# Patient Record
Sex: Female | Born: 1935
Health system: Southern US, Community
[De-identification: ages and names within clinical notes are randomized; demographics above are authoritative.]

## PROBLEM LIST (undated history)

## (undated) DIAGNOSIS — I236 Thrombosis of atrium, auricular appendage, and ventricle as current complications following acute myocardial infarction: Secondary | ICD-10-CM

## (undated) DIAGNOSIS — F0390 Unspecified dementia without behavioral disturbance: Secondary | ICD-10-CM

## (undated) DIAGNOSIS — I255 Ischemic cardiomyopathy: Secondary | ICD-10-CM

## (undated) DIAGNOSIS — I2510 Atherosclerotic heart disease of native coronary artery without angina pectoris: Secondary | ICD-10-CM

## (undated) DIAGNOSIS — I213 ST elevation (STEMI) myocardial infarction of unspecified site: Secondary | ICD-10-CM

## (undated) DIAGNOSIS — E785 Hyperlipidemia, unspecified: Secondary | ICD-10-CM

## (undated) DIAGNOSIS — I10 Essential (primary) hypertension: Secondary | ICD-10-CM

## (undated) DIAGNOSIS — R42 Dizziness and giddiness: Secondary | ICD-10-CM

## (undated) HISTORY — PX: CHOLECYSTECTOMY: SHX55

## (undated) HISTORY — DX: Ischemic cardiomyopathy: I25.5

## (undated) HISTORY — PX: RENAL BIOPSY: SHX156

## (undated) HISTORY — PX: APPENDECTOMY: SHX54

## (undated) HISTORY — DX: ST elevation (STEMI) myocardial infarction of unspecified site: I21.3

## (undated) HISTORY — DX: Dizziness and giddiness: R42

## (undated) HISTORY — PX: HIP SURGERY: SHX245

## (undated) HISTORY — DX: Hyperlipidemia, unspecified: E78.5

## (undated) HISTORY — DX: Thrombosis of atrium, auricular appendage, and ventricle as current complications following acute myocardial infarction: I23.6

## (undated) HISTORY — DX: Essential (primary) hypertension: I10

## (undated) HISTORY — DX: Unspecified dementia, unspecified severity, without behavioral disturbance, psychotic disturbance, mood disturbance, and anxiety: F03.90

## (undated) HISTORY — DX: Atherosclerotic heart disease of native coronary artery without angina pectoris: I25.10

---

## 2010-01-17 ENCOUNTER — Inpatient Hospital Stay (HOSPITAL_COMMUNITY): Admission: RE | Admit: 2010-01-17 | Discharge: 2010-01-24 | Payer: Self-pay | Admitting: Cardiology

## 2010-01-17 ENCOUNTER — Ambulatory Visit: Payer: Self-pay | Admitting: Cardiology

## 2010-01-17 ENCOUNTER — Encounter: Payer: Self-pay | Admitting: Cardiology

## 2010-01-18 ENCOUNTER — Encounter: Payer: Self-pay | Admitting: Cardiovascular Disease

## 2010-01-18 ENCOUNTER — Encounter: Payer: Self-pay | Admitting: Cardiology

## 2010-01-18 ENCOUNTER — Ambulatory Visit: Payer: Self-pay | Admitting: Vascular Surgery

## 2010-01-23 ENCOUNTER — Ambulatory Visit: Payer: Self-pay | Admitting: Physical Medicine & Rehabilitation

## 2010-01-24 ENCOUNTER — Ambulatory Visit: Payer: Self-pay | Admitting: Internal Medicine

## 2010-01-24 ENCOUNTER — Inpatient Hospital Stay (HOSPITAL_COMMUNITY)
Admission: RE | Admit: 2010-01-24 | Discharge: 2010-02-07 | Payer: Self-pay | Admitting: Physical Medicine & Rehabilitation

## 2010-01-24 ENCOUNTER — Encounter: Payer: Self-pay | Admitting: Cardiology

## 2010-01-24 ENCOUNTER — Ambulatory Visit: Payer: Self-pay | Admitting: Physical Medicine & Rehabilitation

## 2010-01-26 ENCOUNTER — Encounter: Payer: Self-pay | Admitting: Internal Medicine

## 2010-01-26 ENCOUNTER — Encounter: Payer: Self-pay | Admitting: Cardiology

## 2010-02-02 ENCOUNTER — Encounter: Payer: Self-pay | Admitting: Cardiovascular Disease

## 2010-02-06 ENCOUNTER — Encounter: Payer: Self-pay | Admitting: Cardiology

## 2010-02-07 ENCOUNTER — Encounter: Payer: Self-pay | Admitting: Cardiology

## 2010-02-10 ENCOUNTER — Encounter: Payer: Self-pay | Admitting: Cardiology

## 2010-02-10 ENCOUNTER — Telehealth: Payer: Self-pay | Admitting: Cardiology

## 2010-02-10 LAB — CONVERTED CEMR LAB: Prothrombin Time: 33.5 s

## 2010-02-14 ENCOUNTER — Encounter: Payer: Self-pay | Admitting: Cardiovascular Disease

## 2010-02-15 ENCOUNTER — Telehealth: Payer: Self-pay | Admitting: Cardiology

## 2010-02-15 ENCOUNTER — Telehealth (INDEPENDENT_AMBULATORY_CARE_PROVIDER_SITE_OTHER): Payer: Self-pay | Admitting: *Deleted

## 2010-02-15 ENCOUNTER — Encounter: Payer: Self-pay | Admitting: Cardiology

## 2010-02-15 LAB — CONVERTED CEMR LAB: INR: 1.71

## 2010-02-21 ENCOUNTER — Ambulatory Visit: Payer: Self-pay | Admitting: Cardiology

## 2010-02-21 ENCOUNTER — Encounter: Payer: Self-pay | Admitting: Cardiology

## 2010-02-21 DIAGNOSIS — I2129 ST elevation (STEMI) myocardial infarction involving other sites: Secondary | ICD-10-CM

## 2010-02-21 DIAGNOSIS — Z8673 Personal history of transient ischemic attack (TIA), and cerebral infarction without residual deficits: Secondary | ICD-10-CM

## 2010-02-21 DIAGNOSIS — I10 Essential (primary) hypertension: Secondary | ICD-10-CM

## 2010-02-21 DIAGNOSIS — S72009A Fracture of unspecified part of neck of unspecified femur, initial encounter for closed fracture: Secondary | ICD-10-CM | POA: Insufficient documentation

## 2010-02-21 DIAGNOSIS — I238 Other current complications following acute myocardial infarction: Secondary | ICD-10-CM

## 2010-02-21 DIAGNOSIS — I2109 ST elevation (STEMI) myocardial infarction involving other coronary artery of anterior wall: Secondary | ICD-10-CM

## 2010-03-02 ENCOUNTER — Encounter: Payer: Self-pay | Admitting: Cardiology

## 2010-03-03 ENCOUNTER — Encounter: Payer: Self-pay | Admitting: Cardiology

## 2010-03-03 ENCOUNTER — Telehealth: Payer: Self-pay | Admitting: Cardiology

## 2010-03-03 LAB — CONVERTED CEMR LAB: Prothrombin Time: 39.3 s

## 2010-03-09 ENCOUNTER — Encounter: Payer: Self-pay | Admitting: Cardiology

## 2010-03-09 ENCOUNTER — Telehealth: Payer: Self-pay | Admitting: Cardiology

## 2010-03-09 LAB — CONVERTED CEMR LAB
INR: 2.83
Prothrombin Time: 28.6 s

## 2010-03-17 ENCOUNTER — Telehealth: Payer: Self-pay | Admitting: Cardiology

## 2010-03-17 ENCOUNTER — Encounter: Payer: Self-pay | Admitting: Cardiology

## 2010-03-28 ENCOUNTER — Encounter: Payer: Self-pay | Admitting: Cardiology

## 2010-03-29 ENCOUNTER — Encounter: Payer: Self-pay | Admitting: Cardiology

## 2010-03-29 ENCOUNTER — Telehealth: Payer: Self-pay | Admitting: Cardiology

## 2010-03-29 LAB — CONVERTED CEMR LAB: Prothrombin Time: 32.1 s

## 2010-04-10 ENCOUNTER — Telehealth (INDEPENDENT_AMBULATORY_CARE_PROVIDER_SITE_OTHER): Payer: Self-pay | Admitting: *Deleted

## 2010-04-11 ENCOUNTER — Ambulatory Visit: Payer: Self-pay | Admitting: Cardiology

## 2010-04-12 ENCOUNTER — Ambulatory Visit: Payer: Self-pay | Admitting: Cardiology

## 2010-04-19 ENCOUNTER — Ambulatory Visit: Payer: Self-pay | Admitting: Cardiology

## 2010-04-19 ENCOUNTER — Encounter: Payer: Self-pay | Admitting: Physician Assistant

## 2010-04-19 DIAGNOSIS — I2510 Atherosclerotic heart disease of native coronary artery without angina pectoris: Secondary | ICD-10-CM | POA: Insufficient documentation

## 2010-04-19 DIAGNOSIS — I2589 Other forms of chronic ischemic heart disease: Secondary | ICD-10-CM | POA: Insufficient documentation

## 2010-04-19 DIAGNOSIS — E782 Mixed hyperlipidemia: Secondary | ICD-10-CM

## 2010-04-21 ENCOUNTER — Encounter (INDEPENDENT_AMBULATORY_CARE_PROVIDER_SITE_OTHER): Payer: Self-pay | Admitting: *Deleted

## 2010-05-17 ENCOUNTER — Encounter: Payer: Self-pay | Admitting: Cardiology

## 2010-06-08 ENCOUNTER — Encounter: Payer: Self-pay | Admitting: Cardiology

## 2010-07-21 ENCOUNTER — Encounter: Payer: Self-pay | Admitting: Cardiology

## 2010-07-25 ENCOUNTER — Encounter (INDEPENDENT_AMBULATORY_CARE_PROVIDER_SITE_OTHER): Payer: Self-pay | Admitting: *Deleted

## 2010-08-03 ENCOUNTER — Ambulatory Visit: Payer: Self-pay | Admitting: Cardiology

## 2010-08-15 ENCOUNTER — Telehealth (INDEPENDENT_AMBULATORY_CARE_PROVIDER_SITE_OTHER): Payer: Self-pay | Admitting: *Deleted

## 2010-08-29 ENCOUNTER — Encounter: Payer: Self-pay | Admitting: Cardiology

## 2010-10-31 NOTE — Medication Information (Signed)
Summary: ccr-lr  Anticoagulant Therapy  Managed by: Vashti Hey RN PCP: Roswell Miners MD: Andee Lineman MD, Michelle Piper Indication 1: apical thrombus 453.8 Indication 2: DVT Lab Used: [facility] [place] Site: [place] INR POC 3.0  Dietary changes: no    Health status changes: no    Bleeding/hemorrhagic complications: no    Recent/future hospitalizations: no    Any changes in medication regimen? no    Recent/future dental: no  Any missed doses?: no       Is patient compliant with meds? yes       Allergies: 1)  ! Penicillin  Anticoagulation Management History:      The patient is taking warfarin and comes in today for a routine follow up visit.  Positive risk factors for bleeding include an age of 61 years or older.  The bleeding index is 'intermediate risk'.  Positive CHADS2 values include History of HTN and Age > 71 years old.  Her last INR was 3.19.  Anticoagulation responsible provider: Andee Lineman MD, Michelle Piper.  INR POC: 3.0.    Anticoagulation Management Assessment/Plan:      The patient's current anticoagulation dose is Warfarin sodium 2 mg tabs: use as directed.  The target INR is 2.0-3.0.  The next INR is due 05/02/2010.  Anticoagulation instructions were given to patient.  Results were reviewed/authorized by Vashti Hey RN.  She was notified by Vashti Hey RN.         Prior Anticoagulation Instructions: Received fax from [facility] with results of PT/INR obtained on pt today.  PT 32.1  INR 3.19  Spoke with Christoper Allegra RN [facility] who states she discharged pt today.  Called pt's son, Clide Cliff, who manages pt's meds.  Told him to have pt. take coumadin 1mg  tonight then resume 2mg  once daily except 1mg  on Mondays and Fridays.  Next INR check will be in the [place] office on 04/11/10.  Ricky verbalized understanding.  Current Anticoagulation Instructions: INR 3.0 Decrease coumadin to 2mg  once daily except 1mg  on M,W,F

## 2010-10-31 NOTE — Miscellaneous (Signed)
Summary: Home Care Report/ [facility] CARE  Home Care Report/ [facility] CARE   Imported By: Dorise Hiss 06/13/2010 14:06:02  _____________________________________________________________________  External Attachment:    Type:   Image     Comment:   External Document

## 2010-10-31 NOTE — Progress Notes (Signed)
Summary: coumadin management  Phone Note Other Incoming   Caller: Christoper Allegra RN [facility] [phone] Reason for Call: Discuss lab or test results Summary of Call: Called re. PT/INR obtained on pt yesterday at [facility].  PT 33.5  INR 3.32  Pt was in hospital at [facility] from 01/24/10 - 02/07/10 for anterior MI.  She has been on coumadin for a DVT in the past and a new atrial thrombus.  Order given for pt to hold coumadin tonight then decrease dose to 2mg  once daily except 1mg  on Mondays.  Home Health to recheck INR on 02/15/10. Initial call taken by: Vashti Hey RN,  Feb 10, 2010 2:39 PM     Anticoagulant Therapy  Managed by: Vashti Hey RN Supervising MD: Myrtis Ser MD, Tinnie Gens Indication 1: apical thrombus 453.8 Indication 2: DVT Lab Used: [facility] Site: [place] PT 33.5  Dietary changes: no    Health status changes: no    Bleeding/hemorrhagic complications: no    Recent/future hospitalizations: yes       Details: [facility] 01/24/10 - 02/07/10 for anterior MI  Any changes in medication regimen? yes       Details: On coumadin for old DVT now new apical thrombus.  Has 2mg  tablet  Has been on 2mg  qd   Recent/future dental: no  Any missed doses?: no       Is patient compliant with meds? yes      Comments: pt being seen by [facility].  Recent hip replacement on 01/02/10     Anticoagulation Management History:      Her anticoagulation is being managed by telephone today.  Positive risk factors for bleeding include an age of 22 years or older.  The bleeding index is 'intermediate risk'.  Positive CHADS2 values include Age > 55 years old.  Today's INR is 3.32.  Prothrombin time is 33.5.  Anticoagulation responsible provider: Myrtis Ser MD, Tinnie Gens.    Anticoagulation Management Assessment/Plan:      The next INR is due 02/15/2010.  Anticoagulation instructions were given to Christoper Allegra RN .  Results were  reviewed/authorized by Vashti Hey RN.  She was notified by Vashti Hey RN.         Current Anticoagulation Instructions: Called re. PT/INR obtained on pt yesterday at [facility].  PT 33.5  INR 3.32  Pt was in hospital at [facility] from 01/24/10 - 02/07/10 for anterior MI.  She has been on coumadin for a DVT in the past and a new atrial thrombus.  Order given for pt to hold coumadin tonight then decrease dose to 2mg  once daily except 1mg  on Mondays.  Home Health to recheck INR on 02/15/10.

## 2010-10-31 NOTE — Progress Notes (Signed)
Summary: elevated bp   Phone Note Other Incoming   Caller: son - Clide Cliff  Summary of Call: Voice message left regarding elevated blood pressure in the afternoons.  153/92, 149/89, 150/86.  Has OV with urology today at 12:45.  Did not leave a return phone number on his message.  Tried to return call to number listed in mothers chart.  First visit with GD is 5/24.     No answer.  Hoover Brunette, LPN  Feb 15, 2010 4:47 PM

## 2010-10-31 NOTE — Progress Notes (Signed)
Summary: coumadin management  Phone Note Outgoing Call   Call placed by: Vashti Hey RN Call placed to: [facility] RN [facility] Reason for Call: Discuss lab or test results Summary of Call: Received fax from [facility] in [place] with results of PT/INR ontained on pt today.  PT 28.6  INR 2.83  Order given for pt to continue coumadin 2mg  once daily except 1mg  on Mondays and Fridays and INR to be rechecked by Home Health on 03/16/10. Initial call taken by: Vashti Hey RN,  March 09, 2010 4:24 PM     Anticoagulant Therapy  Managed by: Vashti Hey RN PCP: Roswell Miners MD: Andee Lineman MD, Michelle Piper Indication 1: apical thrombus 453.8 Indication 2: DVT Lab Used: [facility] Lab  Site: [place] PT 28.6  Dietary changes: no    Health status changes: no    Bleeding/hemorrhagic complications: no    Recent/future hospitalizations: no    Any changes in medication regimen? no    Recent/future dental: no  Any missed doses?: no       Is patient compliant with meds? yes         Anticoagulation Management History:      Her anticoagulation is being managed by telephone today.  Positive risk factors for bleeding include an age of 21 years or older.  The bleeding index is 'intermediate risk'.  Positive CHADS2 values include History of HTN and Age > 68 years old.  Her last INR was 3.90 and today's INR is 2.83.  Prothrombin time is 28.6.  Anticoagulation responsible provider: Andee Lineman MD, Michelle Piper.    Anticoagulation Management Assessment/Plan:      The patient's current anticoagulation dose is Warfarin sodium 2 mg tabs: use as directed.  The target INR is 2.0-3.0.  The next INR is due 03/16/2010.  Anticoagulation instructions were given to [facility].  Results were reviewed/authorized by Vashti Hey RN.  She was notified by [facility].         Prior Anticoagulation Instructions: [facility]ce[facility] fax with results of PT/INR obtained on pt 03/02/10.  PT 39.3   INR 3.90   Order given for pt to hold coumadin tonight then decrease dose to 2mg  once daily except 1mg  on Mondays and Fridays.  Home Health to recheck INR on 03/09/10.  Current Anticoagulation Instructions: Received fax from [facility]. Hospital in [place] with results of PT/INR ontained on pt today.  PT 28.6  INR 2.83  Order given for pt to continue coumadin 2mg  once daily except 1mg  on Mondays and Fridays and INR to be rechecked by Home Health on 03/16/10.

## 2010-10-31 NOTE — Miscellaneous (Signed)
Summary: Orders Update  Clinical Lists Changes  Orders: Added new Referral order of 2-D Echocardiogram (2D Echo) - Signed 

## 2010-10-31 NOTE — Medication Information (Signed)
Summary: ccr-lr  Anticoagulant Therapy  Managed by: Vashti Hey RN Supervising MD: Andee Lineman MD, Michelle Piper Indication 1: apical thrombus 453.8 Indication 2: DVT Lab Used: [facility] [place] Site: [place] INR POC 2.9  Dietary changes: no    Health status changes: no    Bleeding/hemorrhagic complications: no    Recent/future hospitalizations: no    Any changes in medication regimen? no    Recent/future dental: no  Any missed doses?: no       Is patient compliant with meds? yes       Allergies: 1)  ! Penicillin  Anticoagulation Management History:      Positive risk factors for bleeding include an age of 75 years or older.  The bleeding index is 'intermediate risk'.  Positive CHADS2 values include History of HTN and Age > 19 years old.  Her last INR was 1.71.  Anticoagulation responsible provider: Andee Lineman MD, Michelle Piper.  INR POC: 2.9.    Anticoagulation Management Assessment/Plan:      The target INR is 2.0-3.0.  The next INR is due 03/02/2010.  Anticoagulation instructions were given to Family/Sara Swaziland RN .  Results were reviewed/authorized by Vashti Hey RN.  She was notified by Vashti Hey RN.         Prior Anticoagulation Instructions: Received call from [facility] with results of PT/INR obtained on pt yesterday.  PT 17.3  INR 1.71  Order given for pt to take 2mg  once daily except 3mg  on Wednesdays starting today.  INR to be rechecked by [facility] on 02/22/10.  Current Anticoagulation Instructions: INR 2.9 Decrease coumadin to 2mg  once daily  Order given to Sara Swaziland RN [facility] to recheck INR 03/02/10.

## 2010-10-31 NOTE — Cardiovascular Report (Signed)
Summary: Cardiac Catheterization  Cardiac Catheterization   Imported By: Dorise Hiss 02/21/2010 08:28:49  _____________________________________________________________________  External Attachment:    Type:   Image     Comment:   External Document

## 2010-10-31 NOTE — Letter (Signed)
Summary: Engineer, materials at [facility]  [address]   [place], [place] [address]   Phone: [phone]  Fax: [phone]        July 25, 2010 MRN: [id number]   [facility] [address] [place], [place]  [address]   Dear Ms. Rounds,  Your test ordered by Selena Batten has been reviewed by your physician (or physician assistant) and was found to be normal or stable. Your physician (or physician assistant) felt no changes were needed at this time.  ____ Echocardiogram  ____ Cardiac Stress Test  __X__ Lab Work - lipid panel at goal  ____ Peripheral vascular study of arms, legs or neck  ____ CT scan or X-ray  ____ Lung or Breathing test  ____ Other:   Thank you.   Hoover Brunette, LPN    Duane Boston, M.D., F.A.C.C. Thressa Sheller, M.D., F.A.C.C. Oneal Grout, M.D., F.A.C.C. Cheree Ditto, M.D., F.A.C.C. Daiva Nakayama, M.D., F.A.C.C. Kenney Houseman, M.D., F.A.C.C. Jeanne Ivan, PA-C

## 2010-10-31 NOTE — Letter (Signed)
Summary: Engineer, materials at [facility]  [address]   [place], [place] [address]   Phone: [phone]  Fax: [phone]        April 21, 2010 MRN: [id number]   [facility] [address] [place], [place]  [address]   Dear Ms. Madruga,  Your test ordered by Selena Batten has been reviewed by your physician (or physician assistant) and was found to be normal or stable. Your physician (or physician assistant) felt no changes were needed at this time.  ____ Echocardiogram  ____ Cardiac Stress Test  __X__ Lab Work  ____ Peripheral vascular study of arms, legs or neck  ____ CT scan or X-ray  ____ Lung or Breathing test  ____ Other:   Thank you.   Hoover Brunette, LPN    Duane Boston, M.D., F.A.C.C. Thressa Sheller, M.D., F.A.C.C. Oneal Grout, M.D., F.A.C.C. Cheree Ditto, M.D., F.A.C.C. Daiva Nakayama, M.D., F.A.C.C. Kenney Houseman, M.D., F.A.C.C. Jeanne Ivan, PA-C

## 2010-10-31 NOTE — Miscellaneous (Signed)
Summary: Home Care Report/ HOME [facility] [place]  Home Care Report/ HOME [facility] [place]   Imported By: Dorise Hiss 03/21/2010 12:23:18  _____________________________________________________________________  External Attachment:    Type:   Image     Comment:   External Document

## 2010-10-31 NOTE — Progress Notes (Signed)
Summary: FAX REQUEST FOR HOLDING MEDS PRIOR TO EXTRACTIONS  Phone Note Other Incoming   Caller: FAX REQUEST FOR HOLDING MEDS PRIOR TO EXTRACTIONS Summary of Call: Pending OV for extractions.  Dr. Lovell Sheehan, DDS question if pt needs to be off Plavix & when to restart.   Hoover Brunette, LPN  August 15, 2010 10:50 AM   Follow-up for Phone Call        patient had a drug elluding  stent in April 2011.  Therefore the patient cannot come off Plavix yet.  One of two options: Dental extractions to be delayed until April 2012 or proceed with current dental extractions on Plavix as long as no more than 3 teeth are pulled  in one session. Data support teeth can be pulled in this setting on Plavix. patient is at high risk for in-stent thrombosisif Plavix is discontinued now Follow-up by: Lewayne Bunting, MD, [facility],  August 25, 2010 6:55 AM  Additional Follow-up for Phone Call Additional follow up Details #1::        Will fax note today. Additional Follow-up by: Hoover Brunette, LPN,  August 25, 2010 9:40 AM

## 2010-10-31 NOTE — Progress Notes (Signed)
Summary: coumadin management  Phone Note Outgoing Call   Call placed by: Vashti Hey RN Call placed to: [facility]/Ricky pt's son Reason for Call: Discuss lab or test results Summary of Call: Received fax from [facility] with results of PT/INR obtained on pt today.  PT 32.1  INR 3.19  Spoke with Christoper Allegra RN [facility] who states she discharged pt today.  Called pt's son, Clide Cliff, who manages pt's meds.  Told him to have pt. take coumadin 1mg  tonight then resume 2mg  once daily except 1mg  on Mondays and Fridays.  Next INR check will be in the [place] office on 04/11/10.  Ricky verbalized understanding. 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 no    Recent/future dental: no  Any missed doses?: no       Is patient compliant with meds? yes         Anticoagulation Management History:      Her anticoagulation is being managed by telephone today.  Positive risk factors for bleeding include an age of 75 years or older.  The bleeding index is 'intermediate risk'.  Positive CHADS2 values include History of HTN and Age > 62 years old.  Her last INR was 2.83 and today's INR is 3.19.  Prothrombin time is 32.1.  Anticoagulation responsible provider: Dietrich Pates MD, Molly Maduro.    Anticoagulation Management Assessment/Plan:      The patient's current anticoagulation dose is Warfarin sodium 2 mg tabs: use as directed.  The target INR is 2.0-3.0.  The next INR is due 04/11/2010.  Anticoagulation instructions were given to Ricky/pt's son.  Results were reviewed/authorized by  Vashti Hey RN.  She was notified by Ricky/pt's son.         Prior Anticoagulation Instructions: Received fax from [facility] with results of PT/INR obtained on pt 03/16/10.  PT 25.9  INR 2.56  Order given for pt to continue coumadin 2mg  once daily except 1mg  on Mondays and Fridays and Home Health to recheck INR on 03/29/10.  Current Anticoagulation Instructions: Received fax from [facility] with results of PT/INR obtained on pt today.  PT 32.1  INR 3.19  Spoke with Christoper Allegra RN [facility] who states she discharged pt today.  Called pt's son, Clide Cliff, who manages pt's meds.  Told him to have pt. take coumadin 1mg  tonight then resume 2mg  once daily except 1mg  on Mondays and Fridays.  Next INR check will be in the [place] office on 04/11/10.  Ricky verbalized understanding.

## 2010-10-31 NOTE — Assessment & Plan Note (Signed)
Summary: EPH-POST CONE/INR check   Visit Type:  hospital follow-up Primary Provider:  Linna Darner   History of Present Illness: the patient is a 77 year old female with a recent history of hip fracture. 2 weeks after this the patient developed an acute anterior wall myocardial infarction with a 99% stenosis of a proximal LAD. She underwent bare-metal stent placement. Reperfusion therapy was greater than 12 hours after the index event. There was TIMI 2 flow. She also had a 70-80% right coronary artery lesion and a 30% circumflex. Ejection fraction was 25%. A followup echocardiogram demonstrates an ejection fraction of 25-30% with an apical thrombus and the patient was placed on Coumadin. She scarlet triple anticoagulant regimen with aspirin, Plavix and Coumadin. She reports no bleeding.the patient has had no recurrent chest pain or heart failure symptoms.  The patient was placed on beta blockers, ACE inhibitors and spironolactone. Unfortunately none of these medications were carried over after she was transferred to the rehabilitation service. The family is questioning why she felt on any of these medications currently. She also has a resting tachycardia with a heart rate of 110 beats per minute. One of her sons also noted that her blood pressure at times has been running high. The patient has dyslipidemia and has been placed on Crestor.  The patient currently still spends most of her time in a wheelchair as she has limited ability for weightbearing after her hip surgery. She is due to follow up with orthopedic surgery.  Preventive Screening-Counseling & Management  Alcohol-Tobacco     Smoking Status: never  Current Medications (verified): 1)  Aspir-Low 81 Mg Tbec (Aspirin) .... Take 1 Tablet By Mouth Once A Day 2)  Warfarin Sodium 2 Mg Tabs (Warfarin Sodium) .... Use As Directed 3)  Tamsulosin Hcl 0.4 Mg Caps (Tamsulosin Hcl) .... Take 1 Tablet By Mouth Once A Day 4)  Oxycodone Hcl 5 Mg Tabs  (Oxycodone Hcl) .... Take 1-2 By Mouth Every 4hours As Needed 5)  Plavix 75 Mg Tabs (Clopidogrel Bisulfate) .... Take 1 Tablet By Mouth Once A Day 6)  Crestor 40 Mg Tabs (Rosuvastatin Calcium) .... Take 1 Tablet By Mouth Once A Day 7)  Nitrofurantoin Macrocrystal 100 Mg Caps (Nitrofurantoin Macrocrystal) .... Take 1 Tablet By Mouth Two Times A Day  Allergies: 1)  ! Penicillin  Comments:  Nurse/Medical Assistant: The patient's medications and allergies were reviewed with the patient and were updated in the Medication and Allergy Lists. Bottles reviewed.  Past History:  Past Medical History: Last updated: 02/21/2010  Hypertension.   Vertigo.   Mild dementia.   Right hip fracture with hip replacement January 02, 2010.   Hyperlipidemia.  Chest pains Shortness of Breath  Past Surgical History: Last updated: 02/21/2010  Cholecystectomy.   She has had left hip surgery in the past.  Appendectomy.  Cardiac Catheterization kidney biopsy  Family History: Last updated: 02/21/2010  Positive for CAD.   Social History: Last updated: 02/21/2010   The patient lives in a nursing home in the [place].  No   significant tobacco, EtOH, or illicit drug use history.  No herbal meds.   Regular diet and no regular exercise.   Risk Factors: Smoking Status: never (02/21/2010)  Social History: Smoking Status:  never  Review of Systems       The patient complains of fatigue, muscle weakness, and joint pain.  The patient denies malaise, fever, weight gain/loss, vision loss, decreased hearing, hoarseness, chest pain, palpitations, shortness of breath, prolonged cough, wheezing, sleep apnea,  coughing up blood, abdominal pain, blood in stool, nausea, vomiting, diarrhea, heartburn, incontinence, blood in urine, leg swelling, rash, skin lesions, headache, fainting, dizziness, depression, anxiety, enlarged lymph nodes, easy bruising or bleeding, and environmental allergies.    Vital Signs:  Patient  profile:   77 year old female Height:      60 inches Weight:      101 pounds BMI:     19.80 Pulse rate:   103 / minute BP sitting:   124 / 81  (left arm) Cuff size:   regular  Vitals Entered By: Carlye Grippe (Feb 21, 2010 10:58 AM)  Physical Exam  Additional Exam:  General: Well-developed, well-nourished in no distressthe patient sitting in wheelchair. head: Normocephalic and atraumatic eyes PERRLA/EOMI intact, conjunctiva and lids normal nose: No deformity or lesions mouth normal dentition, normal posterior pharynx neck: Supple, no JVD.  No masses, thyromegaly or abnormal cervical nodes lungs: Normal breath sounds bilaterally without wheezing.  Normal percussion heart: regular rate and rhythm with normal S1 and S2, no S3 or S4.  PMI is normal.  No pathological murmurs abdomen: Normal bowel sounds, abdomen is soft and nontender without masses, organomegaly or hernias noted.  No hepatosplenomegaly musculoskeletal: Back normal, normal gait muscle strength and tone normal pulsus: Pulse is normal in all 4 extremities Extremities: No peripheral pitting edema neurologic: Alert and oriented x 3 skin: Intact without lesions or rashes cervical nodes: No significant adenopathy psychologic: Normal affect    Impression & Recommendations:  Problem # 1:  MYOCARDIAL INFARCTION, ANTERIOR WALL (ICD-410.10) the patient status post acuteanterior wall myocardial infarctionl infarction. Unfortunately she is not on a beta blocker and ACE inhibitor and this will be started today.I explained to the patient's sons that beta blockers and ACE inhibitor as are indicated post microinfarction in particular to prevent further remodeling and decreased incidence of arrhythmias. She will resume the drugs today.the patient will also have a slight panel done in one week particularly after the initiation of Aldactone 12.5 mg p.o. q. daily Her updated medication list for this problem includes:    Aspir-low 81 Mg  Tbec (Aspirin) .Marland Kitchen... Take 1 tablet by mouth once a day    Warfarin Sodium 2 Mg Tabs (Warfarin sodium) ..... Use as directed    Plavix 75 Mg Tabs (Clopidogrel bisulfate) .Marland Kitchen... Take 1 tablet by mouth once a day    Carvedilol 3.125 Mg Tabs (Carvedilol) .Marland Kitchen... Take one tablet by mouth twice a day    Lisinopril 5 Mg Tabs (Lisinopril) .Marland Kitchen... Take one tablet by mouth daily  Problem # 2:  LEFT VENTRICULAR FUNCTION, DECREASED (ICD-429.2) the patient is significant LV dysfunction but no clinical heart failure symptoms. We will repeat an echocardiogram in 2 months. If LV function is still depressed and evaluation by EP service as indicated. If no recurrent LV thrombus Coumadin can be stopped and 2 months. I would be inclined to leave the patient at least a year ago Plavix therapy despite her bare metal stent placement. She has significant other residual coronary artery disease.  Problem # 3:  MURAL THROMBUS, APEX OF HEART (ICD-429.79) Assessment: Comment Only  Problem # 4:  HIP FRACTURE (ICD-820.8) the patient will follow up with orthopedic surgery.  Other Orders: T-Basic Metabolic Panel [id number]) T-CBC No Diff ([id number])  Patient Instructions: 1)  Start Coreg (carvedilol) 3.125mg  by mouth two times a day. 2)  Start Lisinopril 5mg  by mouth once daily. 3)  Start Aldactone 25mg  1/2 tablet by mouth once daily. 4)  Your  physician recommends that you go to the [facility] for lab work: DO IN 1 WEEK AROUND 5/31 5)  Your physician has requested that you have an echocardiogram.  Echocardiography is a painless test that uses sound waves to create images of your heart. It provides your doctor with information about the size and shape of your heart and how well your heart's chambers and valves are working.  This procedure takes approximately one hour. There are no restrictions for this procedure. DO ECHO IN 2 MONTHS BEFORE NEXT OFFICE VISIT. 6)  Follow up with Dr. Earnestine Leys on [place], July 20th at  2pm. Prescriptions: SPIRONOLACTONE 25 MG TABS (SPIRONOLACTONE) Take 1/2 tablet by mouth daily  #15 x 6   Entered by:   Cyril Loosen, RN, BSN   Authorized by:   Lewayne Bunting, MD, [facility]   Signed by:   Cyril Loosen, RN, BSN on 02/21/2010   Method used:   [facility]  [address] [address] [id number]       [address]       [place], [place]  [address]       Ph: [phone] or [phone]       Fax: [phone]   RxID:   [id number]   Handout requested. LISINOPRIL 5 MG TABS (LISINOPRIL) Take one tablet by mouth daily  #30 x 6   Entered by:   Cyril Loosen, RN, BSN   Authorized by:   Lewayne Bunting, MD, [facility]   Signed by:   Cyril Loosen, RN, BSN on 02/21/2010   Method used:   [facility]  [address] [id number]       [address]       [place], [place]  [address]       Ph: [phone] or [phone]       Fax: [phone]   RxID:   [id number]   Handout requested. CARVEDILOL 3.125 MG TABS (CARVEDILOL) Take one tablet by mouth twice a day  #60 x 6   Entered by:   Cyril Loosen, RN, BSN   Authorized by:   Lewayne Bunting, MD, [facility]   Signed by:   Cyril Loosen, RN, BSN on 02/21/2010   Method used:   [facility]  [address] [id number]       [address]       [place], [place]  [address]       Ph: [phone] or [phone]       Fax: [phone]   RxID:   [id number]   Handout requested.

## 2010-10-31 NOTE — Progress Notes (Signed)
Summary: coumadin management  Phone Note Outgoing Call   Call placed by: Darolyn Rua RN Call placed to: [facility] Reason for Call: Discuss lab or test results Summary of Call: Receievd fax with results of PT/INR obtained on pt 03/02/10.  PT 39.3  INR 3.90   Order given for pt to hold coumadin tonight then decrease dose to 2mg  once daily except 1mg  on Mondays and Fridays.  Home Health to recheck INR on 03/09/10. Initial call taken by: Vashti Hey RN,  March 03, 2010 9:54 AM      Anticoagulation Management History:      Her anticoagulation is being managed by telephone today.  Positive risk factors for bleeding include an age of 75 years or older.  The bleeding index is 'intermediate risk'.  Positive CHADS2 values include History of HTN and Age > 75 years old.  Her last INR was 1.71 and today's INR is 3.90.  Prothrombin time is 39.3.  Anticoagulation responsible provider: Andee Lineman MD, Michelle Piper.  INR POC: 2.9.    Anticoagulation Management Assessment/Plan:      The patient's current anticoagulation dose is Warfarin sodium 2 mg tabs: use as directed.  The target INR is 2.0-3.0.  The next INR is due 03/09/2010.  Anticoagulation instructions were given to [facility].  Results were reviewed/authorized by Vashti Hey RN.  She was notified by [facility].         Prior Anticoagulation Instructions: INR 2.9 Decrease coumadin to 2mg  once daily  Order given to Sara Swaziland RN [facility] to recheck INR 03/02/10.  Current Anticoagulation Instructions: Receievd fax with results of PT/INR obtained on pt 03/02/10.  PT 39.3  INR 3.90   Order given for pt to hold coumadin tonight then decrease dose to 2mg  once daily except 1mg  on Mondays and Fridays.  Home Health to recheck INR on 03/09/10.   Anticoagulant Therapy  Managed by: Vashti Hey RN PCP: Roswell Miners MD: Andee Lineman MD, Michelle Piper Indication 1: apical thrombus 453.8 Indication 2: DVT Lab Used: [facility] [place]  Site: [place] PT 39.3  Dietary changes: no    Health status changes: no    Bleeding/hemorrhagic complications: no    Recent/future hospitalizations: no    Any changes in medication regimen? no    Recent/future dental: no  Any missed doses?: no       Is patient compliant with meds? yes

## 2010-10-31 NOTE — Letter (Signed)
Summary: Letter/ [facility]  Letter/ [facility]   Imported By: Dorise Hiss 08/30/2010 10:58:56  _____________________________________________________________________  External Attachment:    Type:   Image     Comment:   External Document

## 2010-10-31 NOTE — Miscellaneous (Signed)
Summary: Orders Update - FLP/LFT  Clinical Lists Changes  Orders: Added new Test order of T-Lipid Profile ([id number]) - Signed Added new Test order of T-Hepatic Function ([id number]) - Signed 

## 2010-10-31 NOTE — Miscellaneous (Signed)
Summary: Home Care Report/ [facility]  Home Care Report/ [facility]   Imported By: Dorise Hiss 05/17/2010 16:11:23  _____________________________________________________________________  External Attachment:    Type:   Image     Comment:   External Document

## 2010-10-31 NOTE — Progress Notes (Signed)
Summary: RX REFILL PLAVIX  Phone Note Other Incoming Call back at [facility] Phone [phone]   Caller: NEPHEW Summary of Call: PATIENT HAS BEEN OUT OF PLAVIX SINCE LAST WEDNESDAY. [facility] [place] Initial call taken by: Dorise Hiss,  April 10, 2010 12:01 PM    Prescriptions: PLAVIX 75 MG TABS (CLOPIDOGREL BISULFATE) Take 1 tablet by mouth once a day  #30 x 6   Entered by:   Carlye Grippe   Authorized by:   Lewayne Bunting, MD, [facility]   Signed by:   Carlye Grippe on 04/10/2010   Method used:   [facility]        [facility]  [address] [id number]       [address]       [place], [place]  [address]       Ph: [phone] or [phone]       Fax: [phone]   RxID:   [id number]

## 2010-10-31 NOTE — Assessment & Plan Note (Signed)
Summary: 3 MO FU PER OCT REMINDER   Visit Type:  Follow-up Primary Yolanda Gibson:  none   History of Present Illness: the patient is a 77 year old female with a history of ischemic cardiomyopathy. Last echocardiogram however demonstrated an ejection fraction of 45-50%. Just prior history of apical thrombus which has resolvedchest significant joint problems another right hip fracture with replacement in April earlier this year. She status post anterior wall myocardial infarction also in April treated with a bare-metal stent to the LAD. Her EF was 25% of the time. She's currently doing much better. She denies any shortness of breath orthopnea PND she has no palpitations or syncope. She does have significant hip pain. We reviewed her laboratory work including her LFTs and lipid panel which are at goal.  Preventive Screening-Counseling & Management  Alcohol-Tobacco     Smoking Status: never  Current Medications (verified): 1)  Aspir-Low 81 Mg Tbec (Aspirin) .... Take 1 Tablet By Mouth Once A Day 2)  Tamsulosin Hcl 0.4 Mg Caps (Tamsulosin Hcl) .... Take 1 Tablet By Mouth Once A Day 3)  Oxycodone Hcl 5 Mg Tabs (Oxycodone Hcl) .... Take 1-2 By Mouth Every 4hours As Needed 4)  Plavix 75 Mg Tabs (Clopidogrel Bisulfate) .... Take 1 Tablet By Mouth Once A Day 5)  Coreg 6.25 Mg Tabs (Carvedilol) .... Take 1 Tablet By Mouth Two Times A Day 6)  Lisinopril 5 Mg Tabs (Lisinopril) .... Take One Tablet By Mouth Daily 7)  Spironolactone 25 Mg Tabs (Spironolactone) .... Take 1/2 Tablet By Mouth Daily 8)  Nitrostat 0.4 Mg Subl (Nitroglycerin) .Marland Kitchen.. 1 Tablet Under Tongue At Onset of Chest Pain; You May Repeat Every 5 Minutes For Up To 3 Doses. 9)  Simvastatin 40 Mg Tabs (Simvastatin) .... Take One Tablet By Mouth Daily At Bedtime 10)  Tramadol Hcl 50 Mg Tabs (Tramadol Hcl) .... One Tab Every Six Hours As Needed 11)  Calcium Carbonate 600 Mg Tabs (Calcium Carbonate) .... Take 1 Tablet By Mouth Once A Day 12)  Vitamin  D3 400 Unit Tabs (Cholecalciferol) .... Take 2 (800 Units) Dailyl  Allergies (verified): 1)  ! Penicillin  Comments:  Nurse/Medical Assistant: The patient's medication bottles and allergies were reviewed with the patient and were updated in the Medication and Allergy Lists.  Past History:  Past Medical History: Last updated: 04/19/2010  Hypertension.   Vertigo.   Mild dementia.   Right hip fracture with hip replacement January 02, 2010.   Hyperlipidemia CAD...status post acute anterior STEMI, 4/11, treated with BMS pLAD...EF 25% Apical thrombus (post cath echo)  Past Surgical History: Last updated: 02/21/2010  Cholecystectomy.   She has had left hip surgery in the past.  Appendectomy.  Cardiac Catheterization kidney biopsy  Family History: Last updated: 02/21/2010  Positive for CAD.   Social History: Last updated: 02/21/2010   The patient lives in a nursing home in the [place].  No   significant tobacco, EtOH, or illicit drug use history.  No herbal meds.   Regular diet and no regular exercise.   Risk Factors: Smoking Status: never (08/03/2010)  Vital Signs:  Patient profile:   77 year old female Height:      60 inches Weight:      102 pounds Pulse rate:   80 / minute BP sitting:   121 / 72  (left arm) Cuff size:   regular  Vitals Entered By: Carlye Grippe (August 03, 2010 9:07 AM)  Physical Exam  Additional Exam:  GEN:77 year old female, sitting upright,  frail, but in no distress HEENT: NCAT,PERRLA,EOMI NECK: palpable pulses, no bruits; no JVD; no TM LUNGS: CTA bilaterally HEART: RRR (S1S2); no significant murmurs; no rubs; no gallops ABD: soft, NT; intact BS EXT: intact distal pulses; trace lower extremity edema SKIN: warm, dry MUSC: no obvious deformity NEURO: A/O (x3)     Impression & Recommendations:  Problem # 1:  HIP FRACTURE (ICD-820.8) the patient has significant pain and is given a prescription for Ultram that she can combine with  Tylenol. She is also at risk for osteoporosis and falls and given a prescription for calcium 600 mg a day and vitamin D 3 800 units q. daily.  Problem # 2:  ISCHEMIC CARDIOMYOPATHY (ICD-414.8) stable status post anterior wall myocardial infarction but with significant improvement in ejection fraction to 40-45% with no heart failure symptoms. Her updated medication list for this problem includes:    Aspir-low 81 Mg Tbec (Aspirin) .Marland Kitchen... Take 1 tablet by mouth once a day    Plavix 75 Mg Tabs (Clopidogrel bisulfate) .Marland Kitchen... Take 1 tablet by mouth once a day    Coreg 6.25 Mg Tabs (Carvedilol) .Marland Kitchen... Take 1 tablet by mouth two times a day    Lisinopril 5 Mg Tabs (Lisinopril) .Marland Kitchen... Take one tablet by mouth daily    Spironolactone 25 Mg Tabs (Spironolactone) .Marland Kitchen... Take 1/2 tablet by mouth daily    Nitrostat 0.4 Mg Subl (Nitroglycerin) .Marland Kitchen... 1 tablet under tongue at onset of chest pain; you may repeat every 5 minutes for up to 3 doses.  Problem # 3:  MURAL THROMBUS, APEX OF HEART (ICD-429.79) resolved.  Patient Instructions: 1)  Increase Coreg to 6.25mg  two times a day  2)  Ultram 50mg  every 6 hours as needed  3)  Okay to take Tylenol with Ultram 4)  Calcium 600mg  daily 5)  Vitamin D3 800 units daily 6)  Follow up in  6 months Prescriptions: TRAMADOL HCL 50 MG TABS (TRAMADOL HCL) one tab every six hours as needed  #30 x 1   Entered by:   Hoover Brunette, LPN   Authorized by:   Lewayne Bunting, MD, [facility]   Signed by:   Hoover Brunette, LPN on 16/07/9603   Method used:   [facility]  [address] [facility]       [address]       [place], [place]  [address]       Ph: [phone] or [phone]       Fax: [phone]   RxID:   [id number] COREG 6.25 MG TABS (CARVEDILOL) Take 1 tablet by mouth two times a day  #60 x 6   Entered by:   Hoover Brunette, LPN   Authorized by:   Lewayne Bunting, MD, [facility]   Signed by:   Hoover Brunette, LPN on 40/34/7425   Method used:    [facility]        [facility]  [address] [facility]       [address]       [place], [place]  [address]       Ph: [phone] or [phone]       Fax: [phone]   RxID:   [id number]

## 2010-10-31 NOTE — Assessment & Plan Note (Signed)
Summary: 2 MO F/U PER 5/24 OV-NEEDS ECHO BEFORE APPT-JM   Visit Type:  Follow-up Primary Provider:  Linna Darner   History of Present Illness: patient presents for scheduled early followup.  When last seen, she was placed on low-dose carvedilol and lisinopril, by Dr. Andee Lineman, for treatment of her ischemic cardiomyopathy. A followup 2-D echocardiogram was also ordered. This was completed only one week ago, reviewed by Dr. Andee Lineman, indicating improvement in LVF to 45-50% (25-30% by post-catheterization echo, with apical thrombus, in April). There was no suggestion of LV thrombus.  Clinically, she has not had any recurrent arm pain. She denies any exertional chest discomfort or significant dyspnea. She does not suggest symptoms of decompensated heart failure to. She continues to make slow, but steady, progress after recent right hip surgery, and is ambulating with a walker.  Preventive Screening-Counseling & Management  Alcohol-Tobacco     Smoking Status: never  Current Medications (verified): 1)  Aspir-Low 81 Mg Tbec (Aspirin) .... Take 1 Tablet By Mouth Once A Day 2)  Warfarin Sodium 2 Mg Tabs (Warfarin Sodium) .... Use As Directed 3)  Tamsulosin Hcl 0.4 Mg Caps (Tamsulosin Hcl) .... Take 1 Tablet By Mouth Once A Day 4)  Oxycodone Hcl 5 Mg Tabs (Oxycodone Hcl) .... Take 1-2 By Mouth Every 4hours As Needed 5)  Plavix 75 Mg Tabs (Clopidogrel Bisulfate) .... Take 1 Tablet By Mouth Once A Day 6)  Crestor 40 Mg Tabs (Rosuvastatin Calcium) .... Take 1 Tablet By Mouth Once A Day 7)  Carvedilol 3.125 Mg Tabs (Carvedilol) .... Take One Tablet By Mouth Twice A Day 8)  Lisinopril 5 Mg Tabs (Lisinopril) .... Take One Tablet By Mouth Daily 9)  Spironolactone 25 Mg Tabs (Spironolactone) .... Take 1/2 Tablet By Mouth Daily  Allergies (verified): 1)  ! Penicillin  Comments:  Nurse/Medical Assistant: The patient's medication bottles and allergies were reviewed with the patient and were updated in the  Medication and Allergy Lists.  Past History:  Past Medical History:  Hypertension.   Vertigo.   Mild dementia.   Right hip fracture with hip replacement January 02, 2010.   Hyperlipidemia CAD...status post acute anterior STEMI, 4/11, treated with BMS pLAD...EF 25% Apical thrombus (post cath echo)  Review of Systems       No fevers, chills, hemoptysis, dysphagia, melena, hematocheezia, hematuria, rash, claudication, orthopnea, pnd, pedal edema. chronic cough, essentially dry, which preceded her MI, and has not been exacerbated following addition of ACE inhibitor. All other systems negative.   Vital Signs:  Patient profile:   77 year old female Height:      60 inches Weight:      100 pounds Pulse rate:   96 / minute BP sitting:   102 / 64  (left arm) Cuff size:   regular  Vitals Entered By: Carlye Grippe (April 19, 2010 2:04 PM)  Physical Exam  Additional Exam:  GEN:77 year old female, sitting upright, frail, but in no distress HEENT: NCAT,PERRLA,EOMI NECK: palpable pulses, no bruits; no JVD; no TM LUNGS: CTA bilaterally HEART: RRR (S1S2); no significant murmurs; no rubs; no gallops ABD: soft, NT; intact BS EXT: intact distal pulses; trace lower extremity edema SKIN: warm, dry MUSC: no obvious deformity NEURO: A/O (x3)     Impression & Recommendations:  Problem # 1:  ISCHEMIC CARDIOMYOPATHY (ICD-414.8)  clinically stable, status post acute anterior MI in April. Recent repeat echo shows improved LVEF to 45-50% (25-30% by postop echo, with apical thrombus), and no evidence of residual apical thrombus. Therefore,  we will discontinue Coumadin, and patient is to continue on combination low dose aspirin and Plavix for at least one year. The patient's son has cited severe financial constraints, given that she does not have insurance. We will, therefore, assist in ensuring that she be on as many generic formulations, as possible. Will check followup metabolic profile today, given  the previous addition of ACE inhibitor. We'll plan continued close followup with Dr. Andee Lineman in 3 months.  Problem # 2:  DYSLIPIDEMIA (ICD-272.4)  finish Crestor, and substitute with simvastatin 40 mg daily, secondary to severe financial constraints. Check FLP/LFT profile in 12 weeks.  Problem # 3:  HYPERTENSION (ICD-401.9)  well-controlled on current regimen.  Other Orders: T-Basic Metabolic Panel [phone])  Patient Instructions: 1)  Your physician recommended you take 1 tablet (or 1 spray) under tongue at onset of chest pain; you may repeat every 5 minutes for up to 3 doses. If 3 or more doses are required, call 911 and proceed to the ER immediately. 2)  Finish current supply of Crestor and then start simvastatin (Zocor) 40mg  by mouth at bedtime. 3)  Your physician recommends that you go to the [facility] for a FASTING lipid profile and liver function labs:  DO IN 12 WEEKS. Do not eat or drink after midnight.  4)  Your physician recommends that you go to the [facility] for lab work: TODAY 5)  Stop Coumadin (warfarin). 6)  Your physician wants you to follow-up in: 3 months. You will receive a reminder letter in the mail one-two months in advance. If you don't receive a letter, please call our office to schedule the follow-up appointment. Prescriptions: SIMVASTATIN 40 MG TABS (SIMVASTATIN) Take one tablet by mouth daily at bedtime  #30 x 6   Entered by:   Cyril Loosen, RN, BSN   Authorized by:   Nelida Meuse, PA-C   Signed by:   Cyril Loosen, RN, BSN on 04/19/2010   Method used:   Electronically to        [facility]  [address] [address] [id number] (retail)       [address]       [place], [place]  [address]       Ph: [phone] or [phone]       Fax: [phone]   RxID:   [id number] NITROSTAT 0.4 MG SUBL (NITROGLYCERIN) 1 tablet under tongue at onset of chest pain; you may repeat every 5 minutes for up to 3 doses.  #25 x 2   Entered by:   Cyril Loosen, RN, BSN   Authorized by:   Lewayne Bunting, MD, [facility]   Signed by:   Cyril Loosen, RN, BSN on 04/19/2010   Method used:   [facility] to        [facility]  [address] [id number]       [address]       [place], [place]  [address]       Ph: [phone] or [phone]       Fax: [phone]   RxID:   [id number]  I have reviewed and approved all prescriptions at the time of the office visit. Nelida Meuse, PA-C  April 19, 2010 3:02 PM

## 2010-10-31 NOTE — Consult Note (Signed)
Summary: Consultation Report  Consultation Report   Imported By: Dorise Hiss 02/21/2010 08:23:42  _____________________________________________________________________  External Attachment:    Type:   Image     Comment:   External Document

## 2010-10-31 NOTE — Miscellaneous (Signed)
Summary: Home Care Report/ HOME CARE [facility]  Home Care Report/ HOME CARE [facility]   Imported By: Dorise Hiss 03/23/2010 14:05:54  _____________________________________________________________________  External Attachment:    Type:   Image     Comment:   External Document

## 2010-10-31 NOTE — Miscellaneous (Signed)
Summary: Home Care Report/ [facility] CARE [facility]  Home Care Report/ HOME CARE [facility]   Imported By: Dorise Hiss 02/28/2010 09:14:36  _____________________________________________________________________  External Attachment:    Type:   Image     Comment:   External Document

## 2010-10-31 NOTE — Medication Information (Signed)
Summary: Coumadin Clinic  Anticoagulant Therapy  Managed by: Inactive PCP: Roswell Miners MD: Andee Lineman MD, Michelle Piper Indication 1: apical thrombus 453.8 Indication 2: DVT Lab Used: [facility] [place] Site: [place]          Comments: Coumadin discontinued by Dr Andee Lineman 04/19/10  Allergies: 1)  ! Penicillin  Anticoagulation Management History:      Positive risk factors for bleeding include an age of 75 years or older.  The bleeding index is 'intermediate risk'.  Positive CHADS2 values include History of HTN and Age > 57 years old.  Her last INR was 3.19.  Anticoagulation responsible provider: Andee Lineman MD, Michelle Piper.    Anticoagulation Management Assessment/Plan:      The target INR is 2.0-3.0.  The next INR is due 05/02/2010.  Anticoagulation instructions were given to patient.  Results were reviewed/authorized by Inactive.         Prior Anticoagulation Instructions: INR 3.0 Decrease coumadin to 2mg  once daily except 1mg  on M,W,F

## 2010-10-31 NOTE — Miscellaneous (Signed)
Summary: Home Care Report/ HOME CARE [facility] [place]  Home Care Report/ HOME CARE [facility] [place]   Imported By: Dorise Hiss 03/21/2010 12:25:27  _____________________________________________________________________  External Attachment:    Type:   Image     Comment:   External Document

## 2010-10-31 NOTE — Miscellaneous (Signed)
Summary: Home Care Report/ HOME CARE [facility]  Home Care Report/ HOME [facility]   Imported By: Dorise Hiss 03/29/2010 12:26:11  _____________________________________________________________________  External Attachment:    Type:   Image     Comment:   External Document

## 2010-10-31 NOTE — Progress Notes (Signed)
Summary: coumadin management  Phone Note Other Incoming   Caller: Christoper Allegra RN [facility] Reason for Call: Discuss lab or test results Summary of Call: Received call from [facility] with [facility] with results of PT/INR obtained on pt yesterday.  PT 17.3  INR 1.71  Order given for pt to take 2mg  once daily except 3mg  on Wednesdays starting today.  INR to be rechecked by [facility] on 02/22/10. Initial call taken by: Vashti Hey RN,  Feb 15, 2010 1:42 PM      Anticoagulation Management History:      Her anticoagulation is being managed by telephone today.  Positive risk factors for bleeding include an age of 75 years or older.  The bleeding index is 'intermediate risk'.  Positive CHADS2 values include Age > 17 years old.  Her last INR was 3.32 and today's INR is 1.71.  Prothrombin time is 17.3.  Anticoagulation responsible provider: Diona Browner MD, Remi Deter.    Anticoagulation Management Assessment/Plan:      The target INR is 2.0-3.0.  The next INR is due 02/22/2010.  Anticoagulation instructions were given to Christoper Allegra RN .  Results were reviewed/authorized by Vashti Hey RN.  She was notified by Vashti Hey RN.         Prior Anticoagulation Instructions: Called re. PT/INR obtained on pt yesterday at [facility].  PT 33.5  INR 3.32  Pt was in hospital at [facility] from 01/24/10 - 02/07/10 for anterior MI.  She has been on coumadin for a DVT in the past and a new atrial thrombus.  Order given for pt to hold coumadin tonight then decrease dose to 2mg  once daily except 1mg  on Mondays.  Home Health to recheck INR on 02/15/10.  Current Anticoagulation Instructions: Received call from [facility] with [facility] with results of PT/INR obtained on pt yesterday.  PT 17.3  INR 1.71  Order given for pt to take 2mg  once daily except 3mg  on Wednesdays starting today.  INR to be rechecked by [facility] on 02/22/10.   Anticoagulant Therapy  Managed by: Vashti Hey RN Supervising MD: Diona Browner MD,  Remi Deter Indication 1: apical thrombus 453.8 Indication 2: DVT Lab Used: [facility] [place] Site: [place] PT 17.3  Dietary changes: no    Health status changes: no    Bleeding/hemorrhagic complications: no    Recent/future hospitalizations: no    Any changes in medication regimen? no    Recent/future dental: no  Any missed doses?: no       Is patient compliant with meds? yes

## 2010-10-31 NOTE — Letter (Signed)
Summary: Discharge Summary  Discharge Summary   Imported By: Dorise Hiss 02/21/2010 08:22:12  _____________________________________________________________________  External Attachment:    Type:   Image     Comment:   External Document

## 2010-10-31 NOTE — Progress Notes (Signed)
Summary: coumadin management  Phone Note Outgoing Call   Call placed by: Vashti Hey RN Call placed to: [facility] Reason for Call: Discuss lab or test results Summary of Call: Received fax from [facility] with results of PT/INR obtained on pt 03/16/10.  PT 25.9  INR 2.56  Order given for pt to continue coumadin 2mg  once daily except 1mg  on Mondays and Fridays and Home Health to recheck INR on 03/29/10. Initial call taken by: Vashti Hey RN,  March 17, 2010 10:42 AM     Anticoagulant Therapy  Managed by: Vashti Hey RN PCP: Roswell Miners MD: Andee Lineman MD, Michelle Piper Indication 1: apical thrombus 453.8 Indication 2: DVT Lab Used: [facility] [place] Site: [place]  Dietary changes: no    Health status changes: no    Bleeding/hemorrhagic complications: no    Recent/future hospitalizations: no    Any changes in medication regimen? no    Recent/future dental: no  Any missed doses?: no       Is patient compliant with meds? yes         Anticoagulation Management History:      Her anticoagulation is being managed by telephone today.  Positive risk factors for bleeding include an age of 35 years or older.  The bleeding index is 'intermediate risk'.  Positive CHADS2 values include History of HTN and Age > 79 years old.  Her last INR was 2.83.  Anticoagulation responsible provider: Andee Lineman MD, Michelle Piper.    Anticoagulation Management Assessment/Plan:      The patient's current anticoagulation dose is Warfarin sodium 2 mg tabs: use as directed.  The target INR is 2.0-3.0.  The next INR is due 03/29/2010.  Anticoagulation instructions were given to [facility].  Results were reviewed/authorized by Vashti Hey RN.  She was notified by [facility].         Prior Anticoagulation Instructions: Received fax from [facility] in [place] with results of PT/INR ontained on pt today.  PT 28.6  INR 2.83  Order given for pt to continue  coumadin 2mg  once daily except 1mg  on Mondays and Fridays and INR to be rechecked by Home Health on 03/16/10.  Current Anticoagulation Instructions: Received fax from [facility] with results of PT/INR obtained on pt 03/16/10.  PT 25.9  INR 2.56  Order given for pt to continue coumadin 2mg  once daily except 1mg  on Mondays and Fridays and Home Health to recheck INR on 03/29/10.

## 2010-12-19 LAB — BASIC METABOLIC PANEL
BUN: 16 mg/dL (ref 6–23)
BUN: 22 mg/dL (ref 6–23)
BUN: 24 mg/dL — ABNORMAL HIGH (ref 6–23)
BUN: 26 mg/dL — ABNORMAL HIGH (ref 6–23)
BUN: 21 mg/dL (ref 6–23)
BUN: 23 mg/dL (ref 6–23)
BUN: 25 mg/dL — ABNORMAL HIGH (ref 6–23)
BUN: 31 mg/dL — ABNORMAL HIGH (ref 6–23)
BUN: 31 mg/dL — ABNORMAL HIGH (ref 6–23)
BUN: 36 mg/dL — ABNORMAL HIGH (ref 6–23)
CO2: 35 mEq/L — ABNORMAL HIGH (ref 19–32)
CO2: 35 mEq/L — ABNORMAL HIGH (ref 19–32)
CO2: 35 mEq/L — ABNORMAL HIGH (ref 19–32)
CO2: 36 mEq/L — ABNORMAL HIGH (ref 19–32)
CO2: 37 mEq/L — ABNORMAL HIGH (ref 19–32)
CO2: 38 mEq/L — ABNORMAL HIGH (ref 19–32)
Calcium: 9.2 mg/dL (ref 8.4–10.5)
Calcium: 8.9 mg/dL (ref 8.4–10.5)
Calcium: 9.1 mg/dL (ref 8.4–10.5)
Chloride: 95 mEq/L — ABNORMAL LOW (ref 96–112)
Chloride: 85 mEq/L — ABNORMAL LOW (ref 96–112)
Chloride: 88 mEq/L — ABNORMAL LOW (ref 96–112)
Chloride: 90 mEq/L — ABNORMAL LOW (ref 96–112)
Chloride: 90 mEq/L — ABNORMAL LOW (ref 96–112)
Chloride: 93 mEq/L — ABNORMAL LOW (ref 96–112)
Creatinine, Ser: 0.93 mg/dL (ref 0.4–1.2)
Creatinine, Ser: 0.93 mg/dL (ref 0.4–1.2)
Creatinine, Ser: 0.94 mg/dL (ref 0.4–1.2)
Creatinine, Ser: 0.71 mg/dL (ref 0.4–1.2)
Creatinine, Ser: 0.74 mg/dL (ref 0.4–1.2)
Creatinine, Ser: 0.87 mg/dL (ref 0.4–1.2)
GFR calc Af Amer: >60 mL/min (ref >60)
GFR calc Af Amer: >60 mL/min (ref >60)
GFR calc Af Amer: >60 mL/min (ref >60)
GFR calc Af Amer: >60 mL/min (ref >60)
GFR calc Af Amer: >60 mL/min (ref >60)
GFR calc non Af Amer: 58 mL/min — ABNORMAL LOW (ref >60)
GFR calc non Af Amer: 58 mL/min — ABNORMAL LOW (ref >60)
GFR calc non Af Amer: >60 mL/min (ref >60)
GFR calc non Af Amer: >60 mL/min (ref >60)
GFR calc non Af Amer: 58 mL/min — ABNORMAL LOW (ref >60)
GFR calc non Af Amer: >60 mL/min (ref >60)
GFR calc non Af Amer: >60 mL/min (ref >60)
GFR calc non Af Amer: >60 mL/min (ref >60)
GFR calc non Af Amer: >60 mL/min (ref >60)
Glucose, Bld: 102 mg/dL — ABNORMAL HIGH (ref 70–99)
Glucose, Bld: 91 mg/dL (ref 70–99)
Glucose, Bld: 93 mg/dL (ref 70–99)
Glucose, Bld: 96 mg/dL (ref 70–99)
Glucose, Bld: 100 mg/dL — ABNORMAL HIGH (ref 70–99)
Glucose, Bld: 101 mg/dL — ABNORMAL HIGH (ref 70–99)
Glucose, Bld: 102 mg/dL — ABNORMAL HIGH (ref 70–99)
Glucose, Bld: 106 mg/dL — ABNORMAL HIGH (ref 70–99)
Glucose, Bld: 112 mg/dL — ABNORMAL HIGH (ref 70–99)
Glucose, Bld: 99 mg/dL (ref 70–99)
Potassium: 4.2 mEq/L (ref 3.5–5.1)
Potassium: 4.3 mEq/L (ref 3.5–5.1)
Potassium: 4.4 mEq/L (ref 3.5–5.1)
Potassium: 3.5 mEq/L (ref 3.5–5.1)
Potassium: 3.5 mEq/L (ref 3.5–5.1)
Potassium: 3.7 mEq/L (ref 3.5–5.1)
Potassium: 4 mEq/L (ref 3.5–5.1)
Potassium: 4.4 mEq/L (ref 3.5–5.1)
Potassium: 4.5 mEq/L (ref 3.5–5.1)
Potassium: 5 mEq/L (ref 3.5–5.1)
Sodium: 132 mEq/L — ABNORMAL LOW (ref 135–145)
Sodium: 133 mEq/L — ABNORMAL LOW (ref 135–145)
Sodium: 135 mEq/L (ref 135–145)
Sodium: 132 mEq/L — ABNORMAL LOW (ref 135–145)
Sodium: 133 mEq/L — ABNORMAL LOW (ref 135–145)

## 2010-12-19 LAB — PROTIME-INR
INR: 2.19 — ABNORMAL HIGH (ref 0.00–1.49)
INR: 2.31 — ABNORMAL HIGH (ref 0.00–1.49)
INR: 3.95 — ABNORMAL HIGH (ref 0.00–1.49)
INR: 1.57 — ABNORMAL HIGH (ref 0.00–1.49)
INR: 2.14 — ABNORMAL HIGH (ref 0.00–1.49)
INR: 2.55 — ABNORMAL HIGH (ref 0.00–1.49)
Prothrombin Time: 23.1 seconds — ABNORMAL HIGH (ref 11.6–15.2)
Prothrombin Time: 24.6 seconds — ABNORMAL HIGH (ref 11.6–15.2)
Prothrombin Time: 38.3 seconds — ABNORMAL HIGH (ref 11.6–15.2)
Prothrombin Time: 18.6 seconds — ABNORMAL HIGH (ref 11.6–15.2)
Prothrombin Time: 19.6 seconds — ABNORMAL HIGH (ref 11.6–15.2)
Prothrombin Time: 21.8 seconds — ABNORMAL HIGH (ref 11.6–15.2)
Prothrombin Time: 22.7 seconds — ABNORMAL HIGH (ref 11.6–15.2)
Prothrombin Time: 23.7 seconds — ABNORMAL HIGH (ref 11.6–15.2)
Prothrombin Time: 27.2 seconds — ABNORMAL HIGH (ref 11.6–15.2)
Prothrombin Time: 37.4 seconds — ABNORMAL HIGH (ref 11.6–15.2)

## 2010-12-19 LAB — COMPREHENSIVE METABOLIC PANEL
ALT: 25 U/L (ref 0–35)
AST: 37 U/L (ref 0–37)
Albumin: 2.7 g/dL — ABNORMAL LOW (ref 3.5–5.2)
Alkaline Phosphatase: 92 U/L (ref 39–117)
BUN: 10 mg/dL (ref 6–23)
CO2: 34 mEq/L — ABNORMAL HIGH (ref 19–32)
CO2: 36 mEq/L — ABNORMAL HIGH (ref 19–32)
Calcium: 9.7 mg/dL (ref 8.4–10.5)
Chloride: 86 mEq/L — ABNORMAL LOW (ref 96–112)
Chloride: 87 mEq/L — ABNORMAL LOW (ref 96–112)
Creatinine, Ser: 0.73 mg/dL (ref 0.4–1.2)
Creatinine, Ser: 1.16 mg/dL (ref 0.4–1.2)
GFR calc Af Amer: 55 mL/min — ABNORMAL LOW (ref >60)
GFR calc non Af Amer: 45 mL/min — ABNORMAL LOW (ref >60)
GFR calc non Af Amer: >60 mL/min (ref >60)
Glucose, Bld: 100 mg/dL — ABNORMAL HIGH (ref 70–99)
Glucose, Bld: 119 mg/dL — ABNORMAL HIGH (ref 70–99)
Sodium: 133 mEq/L — ABNORMAL LOW (ref 135–145)
Total Bilirubin: 0.8 mg/dL (ref 0.3–1.2)
Total Bilirubin: 0.8 mg/dL (ref 0.3–1.2)

## 2010-12-19 LAB — CBC
HCT: 34.8 % — ABNORMAL LOW (ref 36.0–46.0)
Hemoglobin: 12.1 g/dL (ref 12.0–15.0)
Hemoglobin: 10.8 g/dL — ABNORMAL LOW (ref 12.0–15.0)
Hemoglobin: 12.6 g/dL (ref 12.0–15.0)
MCHC: 34.8 g/dL (ref 30.0–36.0)
MCHC: 34 g/dL (ref 30.0–36.0)
MCHC: 34.8 g/dL (ref 30.0–36.0)
MCV: 98 fL (ref 78.0–100.0)
MCV: 98.7 fL (ref 78.0–100.0)
Platelets: 420 10*3/uL — ABNORMAL HIGH (ref 150–400)
RBC: 3.55 MIL/uL — ABNORMAL LOW (ref 3.87–5.11)
RBC: 3.18 MIL/uL — ABNORMAL LOW (ref 3.87–5.11)
RBC: 3.66 MIL/uL — ABNORMAL LOW (ref 3.87–5.11)
RDW: 14.3 % (ref 11.5–15.5)
RDW: 14.6 % (ref 11.5–15.5)
WBC: 12 10*3/uL — ABNORMAL HIGH (ref 4.0–10.5)
WBC: 6.6 10*3/uL (ref 4.0–10.5)

## 2010-12-19 LAB — BRAIN NATRIURETIC PEPTIDE
Pro B Natriuretic peptide (BNP): 1084 pg/mL — ABNORMAL HIGH (ref 0.0–100.0)
Pro B Natriuretic peptide (BNP): 2898 pg/mL — ABNORMAL HIGH (ref 0.0–100.0)
Pro B Natriuretic peptide (BNP): 669 pg/mL — ABNORMAL HIGH (ref 0.0–100.0)

## 2010-12-19 LAB — URINALYSIS, MICROSCOPIC ONLY
Bilirubin Urine: NEGATIVE
Glucose, UA: NEGATIVE mg/dL
Leukocytes, UA: NEGATIVE
Nitrite: NEGATIVE
Specific Gravity, Urine: 1.014 (ref 1.005–1.030)
Urobilinogen, UA: 0.2 mg/dL (ref 0.0–1.0)
pH: 5.5 (ref 5.0–8.0)

## 2010-12-19 LAB — CARDIAC PANEL(CRET KIN+CKTOT+MB+TROPI)
CK, MB: 73.4 ng/mL (ref 0.3–4.0)
Relative Index: 17.3 — ABNORMAL HIGH (ref 0.0–2.5)
Total CK: 425 U/L — ABNORMAL HIGH (ref 7–177)
Troponin I: 26.58 ng/mL (ref 0.00–0.06)

## 2010-12-19 LAB — GLUCOSE, CAPILLARY
Glucose-Capillary: 93 mg/dL (ref 70–99)
Glucose-Capillary: 87 mg/dL (ref 70–99)

## 2010-12-19 LAB — HEPATIC FUNCTION PANEL
ALT: 27 U/L (ref 0–35)
AST: 42 U/L — ABNORMAL HIGH (ref 0–37)
Albumin: 3 g/dL — ABNORMAL LOW (ref 3.5–5.2)
Alkaline Phosphatase: 92 U/L (ref 39–117)
Bilirubin, Direct: 0.1 mg/dL (ref 0.0–0.3)
Total Bilirubin: 0.8 mg/dL (ref 0.3–1.2)

## 2010-12-19 LAB — URINE CULTURE: Colony Count: 65000

## 2010-12-19 LAB — DIFFERENTIAL
Basophils Absolute: 0 10*3/uL (ref 0.0–0.1)
Eosinophils Absolute: 0.5 10*3/uL (ref 0.0–0.7)
Eosinophils Relative: 8 % — ABNORMAL HIGH (ref 0–5)
Neutrophils Relative %: 61 % (ref 43–77)

## 2010-12-19 LAB — LIPID PANEL
Cholesterol: 168 mg/dL (ref 0–200)
LDL Cholesterol: 115 mg/dL — ABNORMAL HIGH (ref 0–99)
Triglycerides: 77 mg/dL (ref <150)

## 2010-12-19 LAB — MRSA PCR SCREENING: MRSA by PCR: NEGATIVE

## 2010-12-19 LAB — APTT: aPTT: 38 seconds — ABNORMAL HIGH (ref 24–37)

## 2011-03-09 ENCOUNTER — Other Ambulatory Visit: Payer: Self-pay | Admitting: *Deleted

## 2011-03-09 MED ORDER — CARVEDILOL 6.25 MG PO TABS: 6.2500 mg | ORAL_TABLET | Freq: Two times a day (BID) | ORAL | Status: DC

## 2011-03-13 ENCOUNTER — Encounter: Payer: Self-pay | Admitting: Cardiology

## 2011-03-13 ENCOUNTER — Other Ambulatory Visit: Payer: Self-pay | Admitting: *Deleted

## 2011-03-13 MED ORDER — SPIRONOLACTONE 25 MG PO TABS: 12.5000 mg | ORAL_TABLET | Freq: Every day | ORAL | Status: DC

## 2011-03-13 MED ORDER — LISINOPRIL 5 MG PO TABS: 5.0000 mg | ORAL_TABLET | Freq: Every day | ORAL | Status: DC

## 2011-03-19 ENCOUNTER — Telehealth: Payer: Self-pay | Admitting: Cardiology

## 2011-03-19 MED ORDER — CARVEDILOL 6.25 MG PO TABS: 6.2500 mg | ORAL_TABLET | Freq: Two times a day (BID) | ORAL | Status: DC

## 2011-03-19 NOTE — Telephone Encounter (Signed)
.   Requested Prescriptions   Signed Prescriptions Disp Refills  . carvedilol (COREG) 6.25 MG tablet 60 tablet 6    Sig: Take 1 tablet (6.25 mg total) by mouth 2 (two) times daily.    Authorizing Provider: DE Karma Lew    Ordering User: Lacie Scotts

## 2011-03-23 ENCOUNTER — Ambulatory Visit: Payer: Self-pay | Admitting: Cardiology

## 2011-04-11 ENCOUNTER — Telehealth: Payer: Self-pay | Admitting: *Deleted

## 2011-04-11 NOTE — Telephone Encounter (Signed)
Olegario Messier called regarding clearance on Plavix for several extractions.  Advised her that Dr. Andee Lineman is out of the office, but he usually does not recommend holding coumadin or Plavix as long as no more than 3 teeth being extracted at one visit.  Faxed info on [facility] on Antiplatelet Medication.  Verbalized understanding.

## 2011-05-10 ENCOUNTER — Other Ambulatory Visit: Payer: Self-pay

## 2011-05-10 MED ORDER — CLOPIDOGREL BISULFATE 75 MG PO TABS: 75.0000 mg | ORAL_TABLET | Freq: Every day | ORAL | Status: DC

## 2011-05-10 NOTE — Telephone Encounter (Signed)
.   Requested Prescriptions   Pending Prescriptions Disp Refills  . CLOPIDOGREL BISULFATE 75 MG PO TABS 30 tablet 5    Sig: Take 1 tablet (75 mg total) by mouth daily.    

## 2011-05-18 ENCOUNTER — Ambulatory Visit: Payer: Self-pay | Admitting: Cardiovascular Disease

## 2011-05-24 ENCOUNTER — Ambulatory Visit (INDEPENDENT_AMBULATORY_CARE_PROVIDER_SITE_OTHER): Payer: Medicare Other | Admitting: Cardiovascular Disease

## 2011-05-24 ENCOUNTER — Encounter: Payer: Self-pay | Admitting: Cardiovascular Disease

## 2011-05-24 VITALS — BP 119/74 | HR 66 | Ht 60.0 in | Wt 105.0 lb

## 2011-05-24 DIAGNOSIS — I10 Essential (primary) hypertension: Secondary | ICD-10-CM

## 2011-05-24 DIAGNOSIS — E785 Hyperlipidemia, unspecified: Secondary | ICD-10-CM

## 2011-05-24 DIAGNOSIS — I255 Ischemic cardiomyopathy: Secondary | ICD-10-CM | POA: Insufficient documentation

## 2011-05-24 DIAGNOSIS — I2510 Atherosclerotic heart disease of native coronary artery without angina pectoris: Secondary | ICD-10-CM

## 2011-05-24 DIAGNOSIS — I2589 Other forms of chronic ischemic heart disease: Secondary | ICD-10-CM

## 2011-05-24 NOTE — Assessment & Plan Note (Signed)
She is taking simvastatin 40 mg once daily. Goal LDL is less than 70. Her lipid profile is not available for review.

## 2011-05-24 NOTE — Assessment & Plan Note (Signed)
Most recent ejection fraction was 45%. Continue treatment with carvedilol, lisinopril and spironolactone. She appears to be euvolemic.

## 2011-05-24 NOTE — Patient Instructions (Addendum)
Your physician wants you to follow-up in: 6 months with Dr. Earnestine Leys. You will receive a reminder letter in the mail one-two months in advance. If you don't receive a letter, please call our office to schedule the follow-up appointment. Your physician recommends that you continue on your current medications as directed. Please refer to the Current Medication list given to you today.

## 2011-05-24 NOTE — Assessment & Plan Note (Signed)
Blood pressure is well controlled. Continue current medications. 

## 2011-05-24 NOTE — Progress Notes (Signed)
HPI  This is a 77 year old female who is here today for a six-month followup visit. She has known history of coronary artery disease status post anterior myocardial infarction in April of 2011. She had an angioplasty in bare-metal stent placement at that time to the left anterior descending artery. Her ejection fraction was 25% with apical thrombus. Subsequently, her ejection fraction improved to 45%. She has not had any cardiac events since then. She is feeling reasonably well. She denies any chest pain or dyspnea. There is no orthopnea or PND. She has mild lower extremity edema which is worse at the end of the day.  Allergies  Allergen Reactions  . Penicillins     REACTION: rash     Current Outpatient Prescriptions on File Prior to Visit  Medication Sig Dispense Refill  . aspirin 81 MG tablet Take 81 mg by mouth daily.        . calcium carbonate (OS-CAL) 600 MG TABS Take 600 mg by mouth daily.        . carvedilol (COREG) 6.25 MG tablet Take 1 tablet (6.25 mg total) by mouth 2 (two) times daily.  60 tablet  6  . Cholecalciferol (VITAMIN D3) 400 UNITS CAPS Take 2 capsules by mouth daily.        . clopidogrel (PLAVIX) 75 MG tablet Take 1 tablet (75 mg total) by mouth daily.  30 tablet  5  . lisinopril (PRINIVIL,ZESTRIL) 5 MG tablet Take 1 tablet (5 mg total) by mouth daily.  30 tablet  6  . nitroGLYCERIN (NITROSTAT) 0.4 MG SL tablet Place 0.4 mg under the tongue every 5 (five) minutes as needed.        . OxyCODONE HCl, Abuse Deter, 5 MG TABS Take 1-2 tablets by mouth every 4 (four) hours as needed.        . simvastatin (ZOCOR) 40 MG tablet Take 40 mg by mouth at bedtime.        Marland Kitchen spironolactone (ALDACTONE) 25 MG tablet Take 0.5 tablets (12.5 mg total) by mouth daily.  15 tablet  6     Past Medical History  Diagnosis Date  . Hypertension   . Vertigo   . Dementia     mild  . S/P hip replacement January 02, 2010    Right hip fracture  . Hyperlipidemia   . Post-infarction apical thrombus       resolved  . CAD (coronary artery disease)     status post acute anterior STEMI, 4/11, treated with BMS pLAD.Marland Kitchen EF 255  . Ischemic cardiomyopathy     EF: 25% post MI improved to 45%     Past Surgical History  Procedure Date  . Cholecystectomy   . Appendectomy   . Hip surgery   . Renal biopsy   . Cardiac catheterization 12/2009    LAD : 99% mid, LCX: 30%, RCA 70-80 mid.      Family History  Problem Relation Age of Onset  . Coronary artery disease       History   Social History  . Marital Status: Widowed    Spouse Name: N/A    Number of Children: N/A  . Years of Education: N/A   Occupational History  . Not on file.   Social History Main Topics  . Smoking status: Never Smoker   . Smokeless tobacco: Never Used  . Alcohol Use: No  . Drug Use: No  . Sexually Active: Not on file   Other Topics Concern  . Not on  file   Social History Narrative   The patent lives in a nursing home in [place]. Regular dietNo exercise      PHYSICAL EXAM   BP 119/74  Pulse 66  Ht 5' (1.524 m)  Wt 105 lb (47.628 kg)  BMI 20.51 kg/m2  Constitutional: She is oriented to person, place, and time. She appears well-developed and well-nourished. No distress.  HENT: No nasal discharge.  Head: Normocephalic and atraumatic.  Eyes: Pupils are equal, round, and reactive to light. Right eye exhibits no discharge. Left eye exhibits no discharge.  Neck: Normal range of motion. Neck supple. No JVD present. No thyromegaly present.  Cardiovascular: Normal rate, regular rhythm, normal heart sounds and intact distal pulses. Exam reveals no gallop and no friction rub.  No murmur heard.  Pulmonary/Chest: Effort normal and breath sounds normal. No stridor. No respiratory distress. She has no wheezes. She has no rales. She exhibits no tenderness.  Abdominal: Soft. Bowel sounds are normal. She exhibits no distension. There is no tenderness. There is no rebound and no guarding.  Musculoskeletal: Normal  range of motion. She exhibits +1 edema and no tenderness.  Neurological: She is alert and oriented to person, place, and time. Coordination normal.  Skin: Skin is warm and dry. No rash noted. She is not diaphoretic. No erythema. No pallor.  Psychiatric: She has a normal mood and affect. Her behavior is normal. Judgment and thought content normal.      ASSESSMENT AND PLAN

## 2011-05-24 NOTE — Assessment & Plan Note (Signed)
The patient is doing well at this time from a cardiac standpoint. She's not having any symptoms suggestive of angina. Continue medical therapy. I favor continuing dual antiplatelet therapy at this point although Plavix is optional now given that her myocardial infarction and stenting was more than a year ago. However, given her previous history of left ventricular thrombus as well as residual coronary artery disease, I will keep her on Plavix at this time as long as there are no side effects. This can be stopped though for a week if needed for dental extraction.

## 2011-05-25 ENCOUNTER — Ambulatory Visit: Payer: Self-pay | Admitting: Cardiovascular Disease

## 2011-06-29 ENCOUNTER — Other Ambulatory Visit: Payer: Self-pay | Admitting: Physician Assistant

## 2011-10-06 ENCOUNTER — Other Ambulatory Visit: Payer: Self-pay | Admitting: Cardiology

## 2011-10-08 ENCOUNTER — Other Ambulatory Visit: Payer: Self-pay | Admitting: *Deleted

## 2011-10-08 MED ORDER — CARVEDILOL 6.25 MG PO TABS: 6.2500 mg | ORAL_TABLET | Freq: Two times a day (BID) | ORAL | Status: DC

## 2011-10-22 IMAGING — CR DG CHEST 1V PORT
1 series · 1 of 1 positions shown · non-contrast
Comparison: None.

CLINICAL DATA: Post cardiac catheterization.

PORTABLE CHEST - 1 VIEW

[AP]
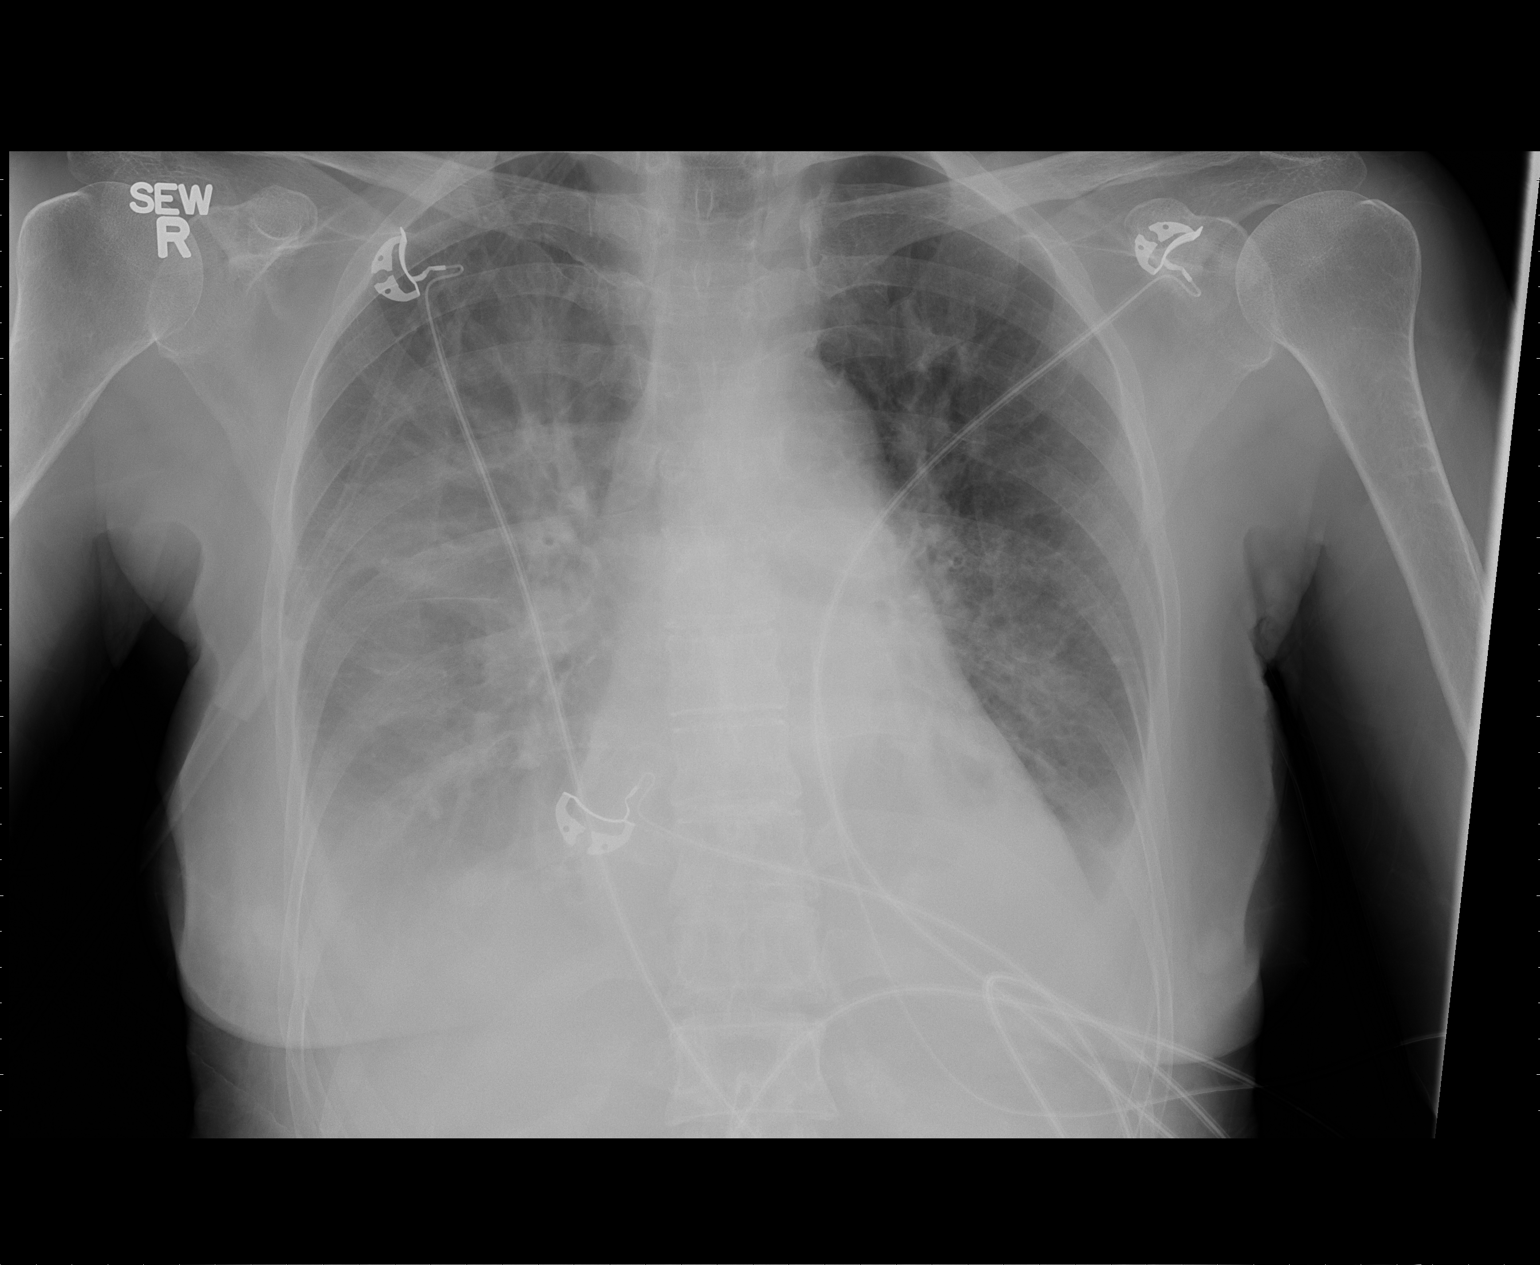

[1 of 1 positions shown; findings below may reference images not displayed]

FINDINGS: Trachea is midline.  Heart size normal.  There is mixed
interstitial and airspace disease with bilateral pleural effusions
and bibasilar atelectasis.
IMPRESSION: Pulmonary edema with bilateral pleural effusions and bibasilar
atelectasis.

## 2011-10-30 IMAGING — CR DG CHEST 2V
1 series · 1 of 1 positions shown · non-contrast
Comparison: 01/18/2010

CLINICAL DATA: Cough, congestion

CHEST - 2 VIEW

[view not recorded]
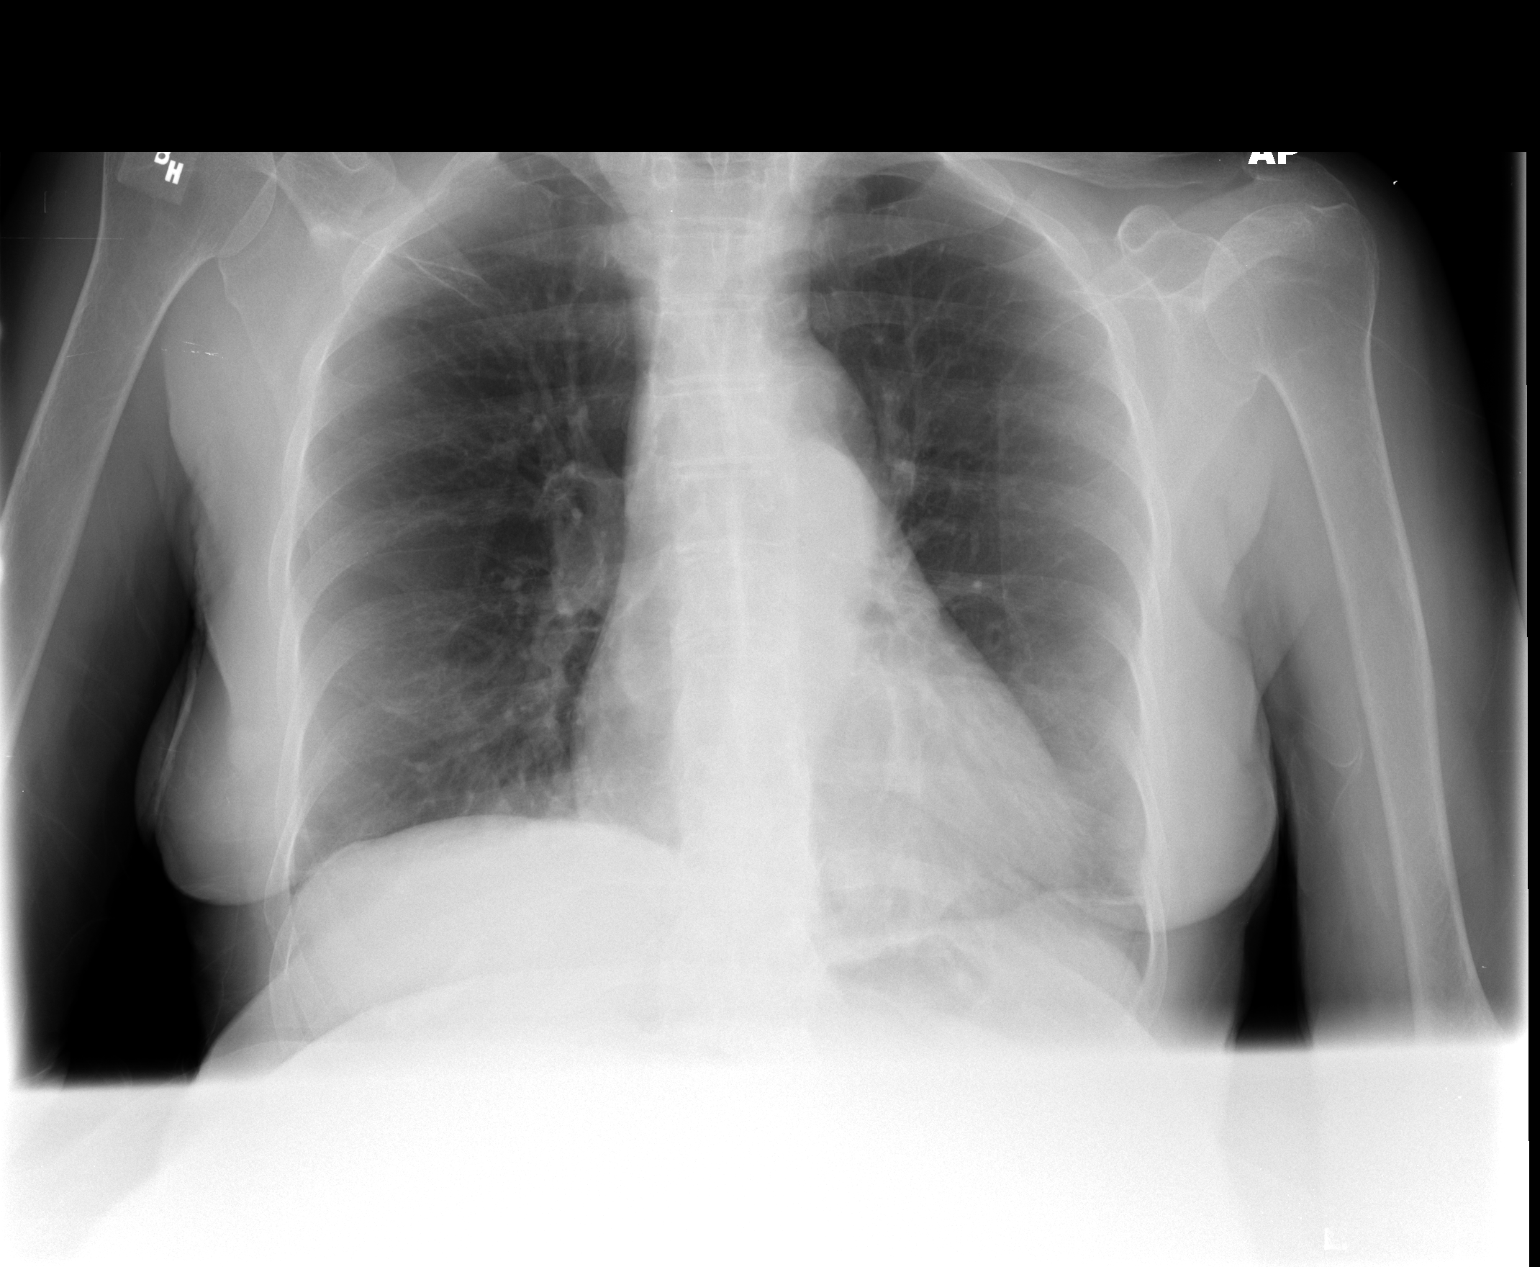

[1 of 1 positions shown; findings below may reference images not displayed]

FINDINGS: Minimal cardiac enlargement.
Tortuous aorta.
Pulmonary vascularity normal.
Underlying COPD.
Minimal residual bibasilar effusions.
Minimal residual left basilar atelectasis.
Bony demineralization.
IMPRESSION: COPD with residual basilar effusions and minimal left basilar
atelectasis.
Resolved pulmonary edema since previous exam.

## 2011-11-06 IMAGING — CR DG CHEST 2V
2 series · 2 of 2 positions shown · non-contrast
Comparison: Chest 01/26/2010.

CLINICAL DATA: Postoperative film.  Deconditioning.

CHEST - 2 VIEW

[w chest pa]
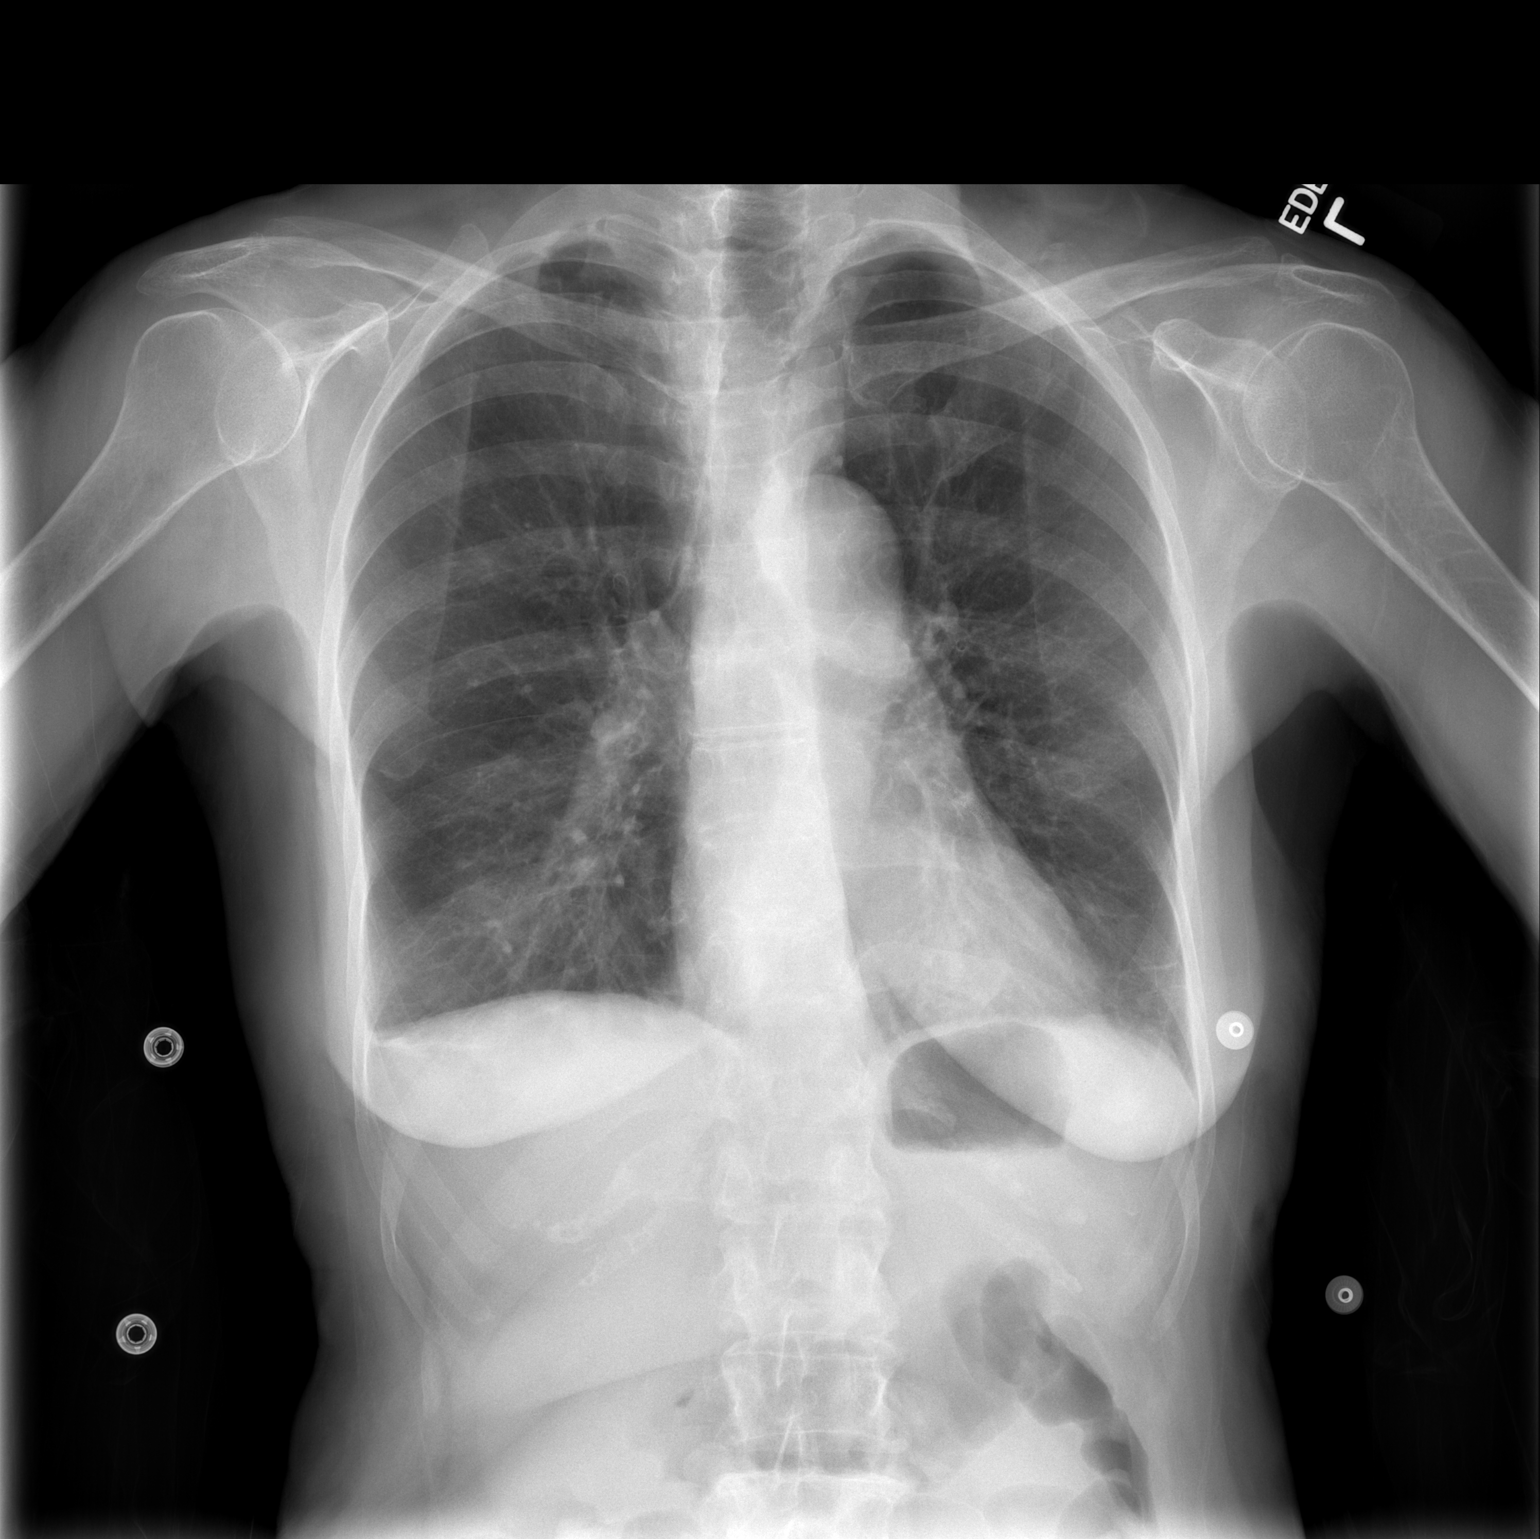

[w chest lat]
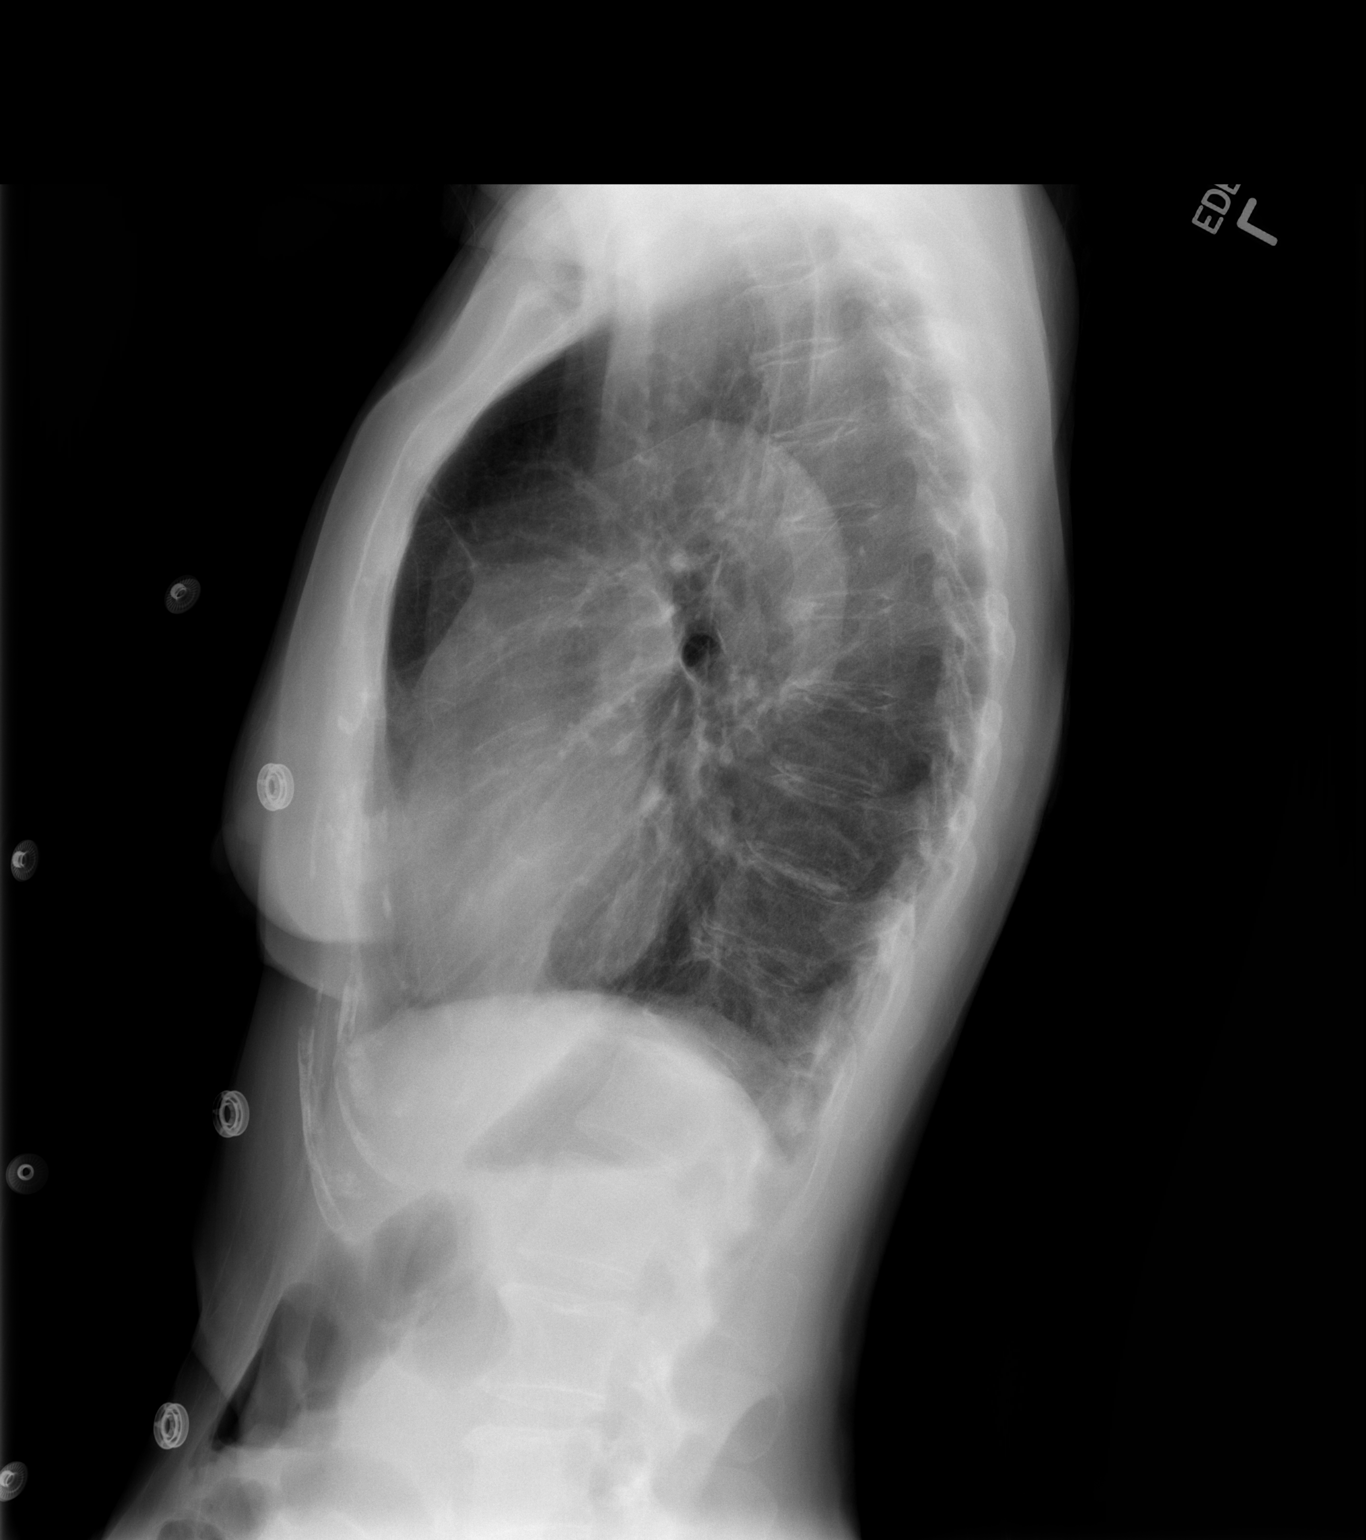

[2 of 2 positions shown; findings below may reference images not displayed]

FINDINGS: Lungs appear emphysematous.  There is some mild
atelectasis in the right base where a small effusion is identified.
Left lung is clear.  Heart size normal.
IMPRESSION: 1.  Tiny right effusion and mild basilar atelectasis.
2.  Emphysema.

## 2011-11-06 IMAGING — CR DG ABDOMEN 1V
1 series · 1 of 1 positions shown · non-contrast
Comparison: None.

CLINICAL DATA: Patient status post surgery.  Constipation.

ABDOMEN - 1 VIEW

[t abdomen supine]
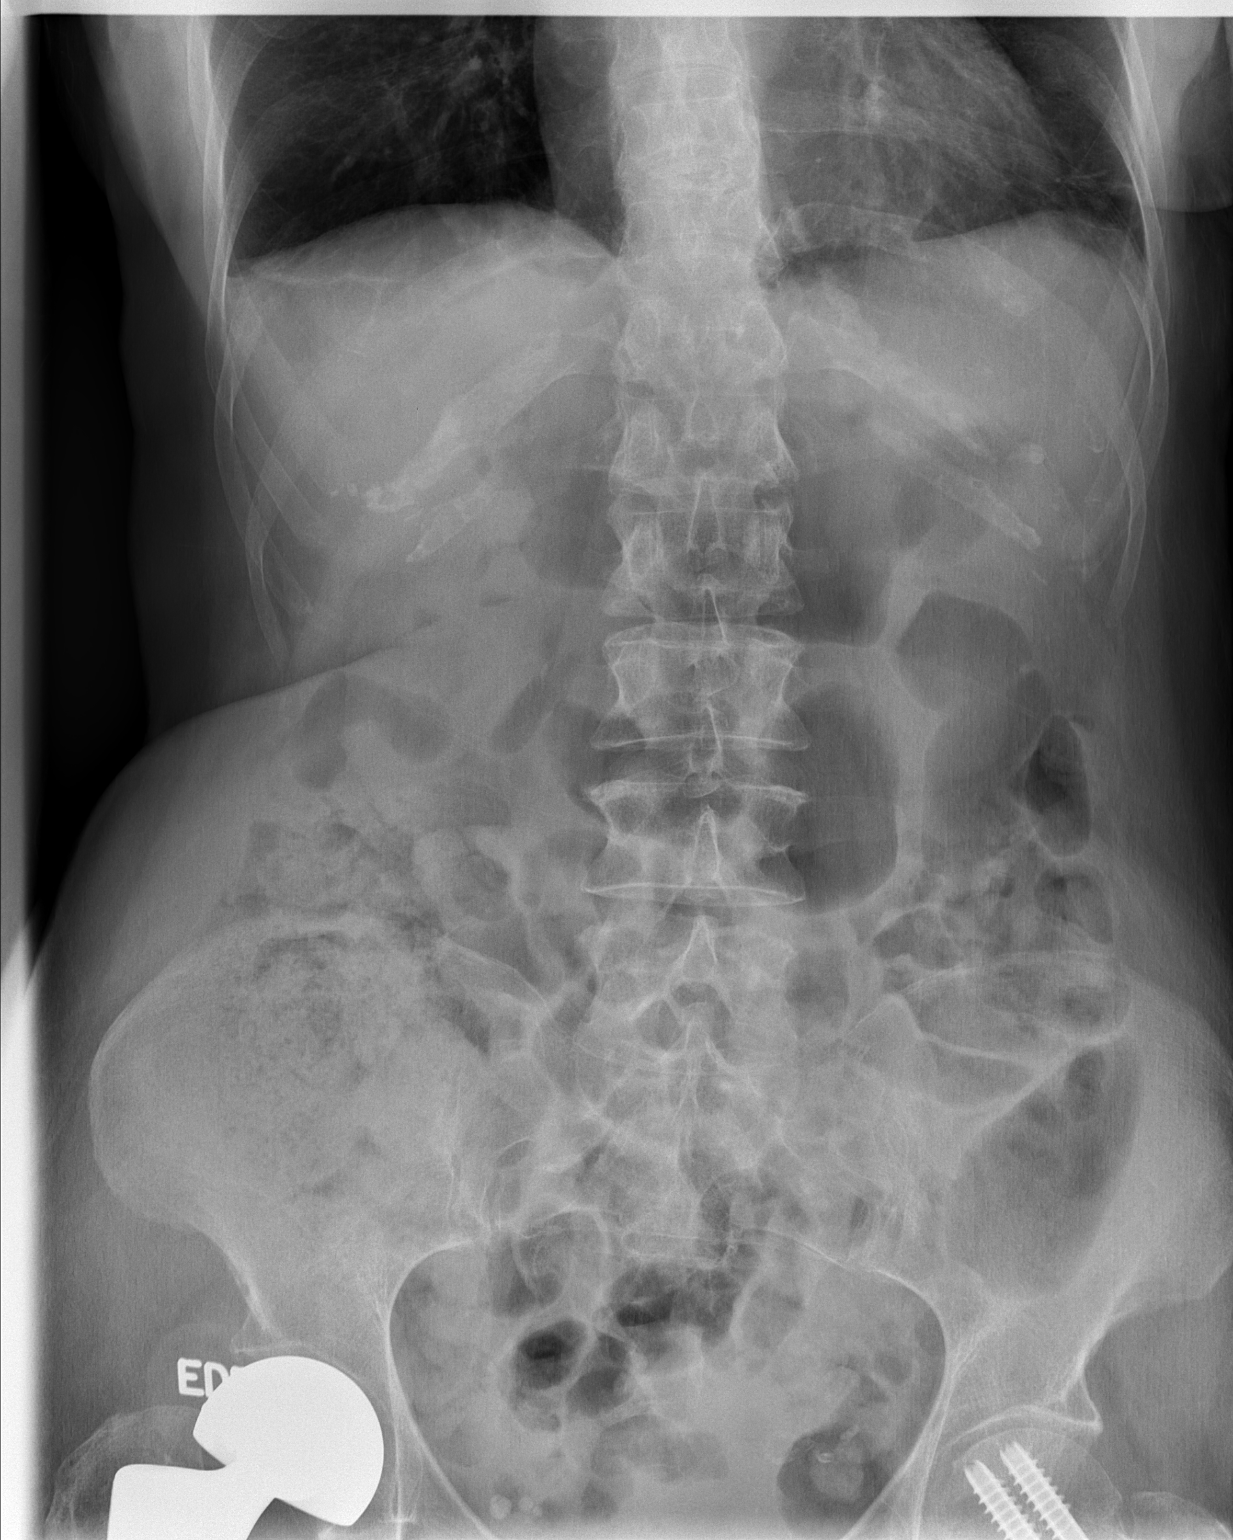

[1 of 1 positions shown; findings below may reference images not displayed]

FINDINGS: There is a moderate volume of stool in the ascending
colon.  No evidence of bowel obstruction or ileus.  No unexpected
abdominal calcification.  Postoperative change about both hips
noted.
IMPRESSION: Moderate volume stool in the ascending colon.

## 2011-11-22 DIAGNOSIS — I10 Essential (primary) hypertension: Secondary | ICD-10-CM | POA: Diagnosis not present

## 2011-11-22 DIAGNOSIS — E782 Mixed hyperlipidemia: Secondary | ICD-10-CM | POA: Diagnosis not present

## 2011-11-22 DIAGNOSIS — H8110 Benign paroxysmal vertigo, unspecified ear: Secondary | ICD-10-CM | POA: Diagnosis not present

## 2011-11-22 DIAGNOSIS — J019 Acute sinusitis, unspecified: Secondary | ICD-10-CM | POA: Diagnosis not present

## 2011-12-04 ENCOUNTER — Other Ambulatory Visit: Payer: Self-pay | Admitting: Cardiology

## 2012-01-29 ENCOUNTER — Other Ambulatory Visit: Payer: Self-pay | Admitting: Cardiology

## 2012-02-10 ENCOUNTER — Other Ambulatory Visit: Payer: Self-pay | Admitting: Cardiology

## 2012-02-11 ENCOUNTER — Other Ambulatory Visit: Payer: Self-pay | Admitting: Cardiology

## 2012-02-11 ENCOUNTER — Other Ambulatory Visit: Payer: Self-pay | Admitting: *Deleted

## 2012-02-11 MED ORDER — SIMVASTATIN 40 MG PO TABS: 40.0000 mg | ORAL_TABLET | Freq: Every day | ORAL | Status: DC

## 2012-02-12 ENCOUNTER — Other Ambulatory Visit: Payer: Self-pay | Admitting: Cardiology

## 2012-02-19 DIAGNOSIS — I10 Essential (primary) hypertension: Secondary | ICD-10-CM | POA: Diagnosis not present

## 2012-02-19 DIAGNOSIS — E782 Mixed hyperlipidemia: Secondary | ICD-10-CM | POA: Diagnosis not present

## 2012-03-21 ENCOUNTER — Other Ambulatory Visit: Payer: Self-pay | Admitting: *Deleted

## 2012-03-21 MED ORDER — CLOPIDOGREL BISULFATE 75 MG PO TABS: 75.0000 mg | ORAL_TABLET | Freq: Every day | ORAL | Status: DC

## 2012-03-24 ENCOUNTER — Ambulatory Visit (INDEPENDENT_AMBULATORY_CARE_PROVIDER_SITE_OTHER): Payer: Medicare Other | Admitting: Cardiology

## 2012-03-24 ENCOUNTER — Encounter: Payer: Self-pay | Admitting: Cardiology

## 2012-03-24 VITALS — BP 108/67 | HR 66 | Ht 60.0 in | Wt 111.8 lb

## 2012-03-24 DIAGNOSIS — I10 Essential (primary) hypertension: Secondary | ICD-10-CM | POA: Diagnosis not present

## 2012-03-24 DIAGNOSIS — E785 Hyperlipidemia, unspecified: Secondary | ICD-10-CM

## 2012-03-24 DIAGNOSIS — I2589 Other forms of chronic ischemic heart disease: Secondary | ICD-10-CM | POA: Diagnosis not present

## 2012-03-24 DIAGNOSIS — I252 Old myocardial infarction: Secondary | ICD-10-CM | POA: Diagnosis not present

## 2012-03-24 NOTE — Patient Instructions (Addendum)
   Echo - due in 6 months just prior to next office visti Your physician wants you to follow up in: 6 months.  You will receive a reminder letter in the mail one-two months in advance.  If you don't receive a letter, please call our office to schedule the follow up appointment

## 2012-03-29 NOTE — Progress Notes (Signed)
Yolanda Bottoms, MD, [facility] Board Certified in Adult Cardiovascular Medicine,Internal Medicine and Critical Care Medicine    CC:         followup patient with coronary artery disease and prior myocardial infarction                                                                         HPI:      The patient is a 78 year old female with a history of hypertension, dyslipidemia, large anterior wall myocardial infarction in 2011 requiring stent placement.  Initial ejection fraction 25% but later improved to 45%. The patient has not required any hospitalizations for heart failure.  He has minimal lower extremity edema.  He denies any chest pain orthopnea PND palpitations or syncope. Blood pressure and lipid panel is being controlled and followed by his primary care physician. The patient remains physically active.   PMH: reviewed and listed in Problem List in Electronic Records (and see below) Past Medical History  Diagnosis Date  . Hypertension   . Vertigo   . Dementia     mild  . S/P hip replacement January 02, 2010    Right hip fracture  . Hyperlipidemia   . Post-infarction apical thrombus     resolved  . CAD (coronary artery disease)     status post acute anterior STEMI, 4/11, treated with BMS pLAD.Marland Kitchen EF 255  . Ischemic cardiomyopathy     EF: 25% post MI improved to 45%   Past Surgical History  Procedure Date  . Cholecystectomy   . Appendectomy   . Hip surgery   . Renal biopsy   . Cardiac catheterization 12/2009    LAD : 99% mid, LCX: 30%, RCA 70-80 mid.     Allergies/SH/FHX : available in Electronic Records for review  Allergies  Allergen Reactions  . Penicillins     REACTION: rash   History   Social History  . Marital Status: Widowed    Spouse Name: N/A    Number of Children: N/A  . Years of Education: N/A   Occupational History  . Not on file.   Social History Main Topics  . Smoking status: Never Smoker   . Smokeless tobacco: Never Used  . Alcohol Use: No    . Drug Use: No  . Sexually Active: Not on file   Other Topics Concern  . 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 Regular dietNo exercise   Family History  Problem Relation Age of Onset  . Coronary artery disease      Medications: Current Outpatient Prescriptions  Medication Sig Dispense Refill  . aspirin 81 MG tablet Take 81 mg by mouth daily.        . calcium carbonate (OS-CAL) 600 MG TABS Take 600 mg by mouth daily.        . carvedilol (COREG) 6.25 MG tablet Take 1 tablet (6.25 mg total) by mouth 2 (two) times daily.  60 tablet  6  . Cholecalciferol (VITAMIN D3) 400 UNITS CAPS Take 2 capsules by mouth daily.        . clopidogrel (PLAVIX) 75 MG tablet Take 1 tablet (75 mg total)  by mouth daily.  30 tablet  3  . lisinopril (PRINIVIL,ZESTRIL) 5 MG tablet TAKE 1 TABLET BY MOUTH EVERY DAY  30 tablet  6  . nitroGLYCERIN (NITROSTAT) 0.4 MG SL tablet Place 0.4 mg under the tongue every 5 (five) minutes as needed.        . OxyCODONE HCl, Abuse Deter, 5 MG TABS Take 1-2 tablets by mouth every 4 (four) hours as needed.        . simvastatin (ZOCOR) 40 MG tablet TAKE 1 TABLET BY MOUTH DAILY AT BEDTIME  30 tablet  0  . spironolactone (ALDACTONE) 25 MG tablet TAKE 1/2 TABLET BY MOUTH EVERY DAY  15 tablet  6    ROS: No nausea or vomiting. No fever or chills.No melena or hematochezia.No bleeding.No claudication  Physical Exam: BP 108/67  Pulse 66  Ht 5' (1.524 m)  Wt 111 lb 12.8 oz (50.712 kg)  BMI 21.83 kg/m2  SpO2 99% General:well-nourished female in no distress Neck:normal carotid upstroke and no carotid bruit no thyromegaly or JVP is 6 cm Lungs:clear breath sounds bilaterally.  No wheezing Cardiac:regular rate and rhythm with normal S1 and S2 normal normal exam otherwise with no S3 and no pathological murmurs Vascular:no edema.  Normal distal pulses Skin:warm and dry Physcologic:normal affect  12lead ZOX:WRUEAV sinus rhythm.  Old  anterior infarct pattern otherwise no acute changes Limited bedside ECHO:N/A No images are attached to the encounter.   I reviewed and summarized the old records. I reviewed ECG and prior blood work.  Assessment and Plan  ISCHEMIC CARDIOMYOPATHY Previous ejection fraction 25% more recently 45%.  The patient has some edema but no evidence of significant heart failure.  He also had no hospitalizations for heart failure.a followup echocardiogram to assess LV function is being ordered  HYPERTENSION Blood pressure well controlled.  Patient on spironolactone.  We will check a BMET and a magnesium.  DYSLIPIDEMIA Dyslipidemia and management of lipids is followed by his primary care provider    Patient Active Problem List  Diagnosis  . DYSLIPIDEMIA  . HYPERTENSION  . MYOCARDIAL INFARCTION, ANTERIOR WALL  . ACUT MYOCARD INFARCT OTH LAT WALL INIT EPIS CARE  . CAD  . ISCHEMIC CARDIOMYOPATHY  . MURAL THROMBUS, APEX OF HEART  . HIP FRACTURE  . PERSONAL HX TIA & CI W/O RESIDUAL DEFICITS  . Ischemic cardiomyopathy

## 2012-03-29 NOTE — Assessment & Plan Note (Addendum)
Previous ejection fraction 25% more recently 45%.  The patient has some edema but no evidence of significant heart failure.  He also had no hospitalizations for heart failure.a followup echocardiogram to assess LV function is being ordered

## 2012-03-29 NOTE — Assessment & Plan Note (Signed)
Dyslipidemia and management of lipids is followed by his primary care provider

## 2012-03-29 NOTE — Assessment & Plan Note (Signed)
Blood pressure well controlled.  Patient on spironolactone.  We will check a BMET and a magnesium.

## 2012-04-12 ENCOUNTER — Other Ambulatory Visit: Payer: Self-pay | Admitting: Cardiology

## 2012-04-28 ENCOUNTER — Other Ambulatory Visit: Payer: Self-pay | Admitting: Cardiology

## 2012-05-20 DIAGNOSIS — I10 Essential (primary) hypertension: Secondary | ICD-10-CM | POA: Diagnosis not present

## 2012-08-04 DIAGNOSIS — B029 Zoster without complications: Secondary | ICD-10-CM | POA: Diagnosis not present

## 2012-08-20 ENCOUNTER — Other Ambulatory Visit: Payer: Self-pay | Admitting: *Deleted

## 2012-08-20 DIAGNOSIS — I255 Ischemic cardiomyopathy: Secondary | ICD-10-CM

## 2012-08-21 ENCOUNTER — Ambulatory Visit: Payer: Medicare Other

## 2012-08-21 DIAGNOSIS — B029 Zoster without complications: Secondary | ICD-10-CM | POA: Diagnosis not present

## 2012-08-27 ENCOUNTER — Other Ambulatory Visit: Payer: Self-pay

## 2012-08-27 ENCOUNTER — Other Ambulatory Visit (INDEPENDENT_AMBULATORY_CARE_PROVIDER_SITE_OTHER): Payer: Medicare Other

## 2012-08-27 DIAGNOSIS — I2589 Other forms of chronic ischemic heart disease: Secondary | ICD-10-CM | POA: Diagnosis not present

## 2012-08-27 DIAGNOSIS — I255 Ischemic cardiomyopathy: Secondary | ICD-10-CM

## 2012-09-04 ENCOUNTER — Telehealth: Payer: Self-pay | Admitting: *Deleted

## 2012-09-04 NOTE — Telephone Encounter (Signed)
Left message on voicemail for son to call back since number left below wasn't a working number.

## 2012-09-04 NOTE — Telephone Encounter (Addendum)
Left message on son's voicemail to have patient call office.

## 2012-09-04 NOTE — Telephone Encounter (Signed)
Patient informed. 

## 2012-09-04 NOTE — Telephone Encounter (Signed)
Yolanda Gibson call the office. Please call on cell # [phone]

## 2012-09-04 NOTE — Telephone Encounter (Signed)
Message copied by Eustace Moore on Thu Sep 04, 2012  9:04 AM ------      Message from: MCDOWELL, Illene Bolus      Created: Wed Aug 27, 2012  1:04 PM       Last office note in the chart is from Dr. Andee Lineman. Study done in followup of LV function, now 55% with apical anteroseptal motion abnormality consistent with previous event. Can review further office followup.

## 2012-09-21 ENCOUNTER — Encounter: Payer: Self-pay | Admitting: Cardiology

## 2012-09-21 DIAGNOSIS — I2510 Atherosclerotic heart disease of native coronary artery without angina pectoris: Secondary | ICD-10-CM | POA: Insufficient documentation

## 2012-09-22 ENCOUNTER — Encounter: Payer: Self-pay | Admitting: Cardiology

## 2012-09-22 ENCOUNTER — Ambulatory Visit (INDEPENDENT_AMBULATORY_CARE_PROVIDER_SITE_OTHER): Payer: Medicare Other | Admitting: Cardiology

## 2012-09-22 VITALS — BP 147/76 | HR 81 | Ht 60.0 in | Wt 111.0 lb

## 2012-09-22 DIAGNOSIS — I2510 Atherosclerotic heart disease of native coronary artery without angina pectoris: Secondary | ICD-10-CM | POA: Diagnosis not present

## 2012-09-22 DIAGNOSIS — I10 Essential (primary) hypertension: Secondary | ICD-10-CM | POA: Diagnosis not present

## 2012-09-22 DIAGNOSIS — E785 Hyperlipidemia, unspecified: Secondary | ICD-10-CM | POA: Diagnosis not present

## 2012-09-22 DIAGNOSIS — I2589 Other forms of chronic ischemic heart disease: Secondary | ICD-10-CM

## 2012-09-22 DIAGNOSIS — I255 Ischemic cardiomyopathy: Secondary | ICD-10-CM

## 2012-09-22 MED ORDER — NITROGLYCERIN 0.4 MG SL SUBL: 0.4000 mg | SUBLINGUAL_TABLET | SUBLINGUAL | Status: DC | PRN

## 2012-09-22 NOTE — Assessment & Plan Note (Signed)
Symptomatically stable on present medical regimen. No changes made today. Continue observation.

## 2012-09-22 NOTE — Patient Instructions (Addendum)

## 2012-09-22 NOTE — Assessment & Plan Note (Signed)
Recent assessment of LVEF in November showed improvement to the normal range. Continue present medications.

## 2012-09-22 NOTE — Assessment & Plan Note (Signed)
Will request most recent lipid numbers from Dr. Olena Leatherwood.

## 2012-09-22 NOTE — Progress Notes (Signed)
Clinical Summary Ms. Rowand is an 78 y.o.female presenting for followup. She is a former patient of Dr. Andee Lineman, prefers to stay with [facility]. She was last seen in June. She is here with her son today. She denies any angina symptoms, remains functional with her ADLs. Does use a walker to ambulate. Denies any recent falls.  Recent echocardiogram from November showed improvement in LVEF to 55% with mild LVH, mid apical anteroseptal hypokinesis, grade 1 diastolic dysfunction, trivial mitral regurgitation. We discussed this today. She reports compliance with her medications. Also states that she has had followup lab work for cholesterol with Dr. Olena Leatherwood.   Allergies  Allergen Reactions  . Penicillins     REACTION: rash    Current Outpatient Prescriptions  Medication Sig Dispense Refill  . aspirin 81 MG tablet Take 81 mg by mouth daily.        . calcium carbonate (OS-CAL) 600 MG TABS Take 600 mg by mouth daily.        . carvedilol (COREG) 6.25 MG tablet TAKE 1 TABLET BY MOUTH TWICE A DAY  60 tablet  6  . Cholecalciferol (VITAMIN D3) 400 UNITS CAPS Take 2 capsules by mouth daily.        . clopidogrel (PLAVIX) 75 MG tablet Take 1 tablet (75 mg total) by mouth daily.  30 tablet  3  . gabapentin (NEURONTIN) 100 MG capsule Take 100 mg by mouth 2 (two) times daily.      Marland Kitchen lisinopril (PRINIVIL,ZESTRIL) 5 MG tablet TAKE 1 TABLET BY MOUTH EVERY DAY  30 tablet  6  . nitroGLYCERIN (NITROSTAT) 0.4 MG SL tablet Place 0.4 mg under the tongue every 5 (five) minutes as needed.        . OxyCODONE HCl, Abuse Deter, 5 MG TABS Take 1-2 tablets by mouth every 4 (four) hours as needed.        . simvastatin (ZOCOR) 40 MG tablet TAKE 1 TABLET BY MOUTH AT BEDTIME  30 tablet  6  . spironolactone (ALDACTONE) 25 MG tablet TAKE 1/2 TABLET BY MOUTH EVERY DAY  15 tablet  6    Past Medical History  Diagnosis Date  . Essential hypertension, benign   . Vertigo   . Dementia     Mild  . Hyperlipidemia   .  Post-infarction apical thrombus     Resolved  . Coronary atherosclerosis of native coronary artery     BMS LAD 4/11, residual RCA 70-80% mid  . Ischemic cardiomyopathy     LVEF up to 55% 11/13  . STEMI (ST elevation myocardial infarction)     Anterior 4/11    Social History Ms. Lesko reports that she has never smoked. She has never used smokeless tobacco. Ms. Orsborn reports that she does not drink alcohol.  Review of Systems No palpitations or syncope. No reported bleeding episodes. No orthopnea or PND. Otherwise negative.  Physical Examination Filed Vitals:   09/22/12 1336  BP: 147/76  Pulse: 81   Filed Weights   09/22/12 1336  Weight: 111 lb (50.349 kg)   Thin elderly woman in no acute distress. HEENT: Conjunctiva and lids normal, oropharynx clear. Neck: Supple, no elevated JVP or carotid bruits, no thyromegaly. Lungs: Clear to auscultation, nonlabored breathing at rest. Cardiac: Regular rate and rhythm, no S3, no pericardial rub. Abdomen: Soft, nontender,bowel sounds present. Extremities: No pitting edema, distal pulses 2+. Skin: Warm and dry. Musculoskeletal: No kyphosis. Neuropsychiatric: Alert and oriented x3, affect grossly appropriate.   Problem List and Plan  Coronary atherosclerosis of native coronary artery Symptomatically stable on present medical regimen. No changes made today. Continue observation.  Ischemic cardiomyopathy Recent assessment of LVEF in November showed improvement to the normal range. Continue present medications.  DYSLIPIDEMIA Will request most recent lipid numbers from Dr. Olena Leatherwood.  Essential hypertension, benign Blood pressure elevated today. Keep followup with Dr. Olena Leatherwood. Lisinopril could be further advanced if needed.    Jonelle Sidle, M.D., F.A.C.C.

## 2012-09-22 NOTE — Assessment & Plan Note (Signed)
Blood pressure elevated today. Keep followup with Dr. Olena Leatherwood. Lisinopril could be further advanced if needed.

## 2012-10-10 DIAGNOSIS — D649 Anemia, unspecified: Secondary | ICD-10-CM | POA: Diagnosis not present

## 2012-10-10 DIAGNOSIS — K921 Melena: Secondary | ICD-10-CM | POA: Diagnosis not present

## 2012-11-02 ENCOUNTER — Other Ambulatory Visit: Payer: Self-pay | Admitting: Cardiology

## 2012-11-04 DIAGNOSIS — K922 Gastrointestinal hemorrhage, unspecified: Secondary | ICD-10-CM | POA: Diagnosis not present

## 2012-11-24 ENCOUNTER — Other Ambulatory Visit: Payer: Self-pay | Admitting: *Deleted

## 2012-11-24 MED ORDER — CLOPIDOGREL BISULFATE 75 MG PO TABS: 75.0000 mg | ORAL_TABLET | Freq: Every day | ORAL | Status: DC

## 2012-11-25 ENCOUNTER — Other Ambulatory Visit: Payer: Self-pay | Admitting: Cardiology

## 2012-11-30 ENCOUNTER — Other Ambulatory Visit: Payer: Self-pay | Admitting: Cardiology

## 2013-01-09 DIAGNOSIS — I10 Essential (primary) hypertension: Secondary | ICD-10-CM | POA: Diagnosis not present

## 2013-03-30 ENCOUNTER — Encounter: Payer: Self-pay | Admitting: Cardiology

## 2013-03-30 ENCOUNTER — Ambulatory Visit (INDEPENDENT_AMBULATORY_CARE_PROVIDER_SITE_OTHER): Payer: Medicare Other | Admitting: Cardiology

## 2013-03-30 VITALS — BP 134/75 | HR 66 | Ht 60.0 in | Wt 109.0 lb

## 2013-03-30 DIAGNOSIS — I2589 Other forms of chronic ischemic heart disease: Secondary | ICD-10-CM | POA: Diagnosis not present

## 2013-03-30 DIAGNOSIS — I2510 Atherosclerotic heart disease of native coronary artery without angina pectoris: Secondary | ICD-10-CM | POA: Diagnosis not present

## 2013-03-30 DIAGNOSIS — I10 Essential (primary) hypertension: Secondary | ICD-10-CM

## 2013-03-30 DIAGNOSIS — I255 Ischemic cardiomyopathy: Secondary | ICD-10-CM

## 2013-03-30 DIAGNOSIS — E785 Hyperlipidemia, unspecified: Secondary | ICD-10-CM

## 2013-03-30 NOTE — Assessment & Plan Note (Signed)
Pending visit with Dr. Olena Leatherwood soon.

## 2013-03-30 NOTE — Assessment & Plan Note (Signed)
No change to current regimen. 

## 2013-03-30 NOTE — Progress Notes (Signed)
Clinical Summary Yolanda Gibson is an 79 y.o.female last seen in December 2013. She is here with her son. Reports no progressive angina, no cardiac hospitalizations. Does get intermittent leg edema.  Weight is down 2 pounds from the last visit. ECG reviewed showing sinus rhythm with low voltage and old anterior infarct pattern.  She uses a walker, denies any falls. She will be seeing Dr. Olena Leatherwood for a physical soon.  She reports compliance with her medications.  Allergies  Allergen Reactions  . Penicillins     REACTION: rash    Current Outpatient Prescriptions  Medication Sig Dispense Refill  . aspirin 81 MG tablet Take 81 mg by mouth daily.        . calcium carbonate (OS-CAL) 600 MG TABS Take 600 mg by mouth daily.        . carvedilol (COREG) 6.25 MG tablet TAKE 1 TABLET BY MOUTH TWICE A DAY  60 tablet  6  . Cholecalciferol (VITAMIN D3) 400 UNITS CAPS Take 2 capsules by mouth daily.        . clopidogrel (PLAVIX) 75 MG tablet Take 1 tablet (75 mg total) by mouth daily.  30 tablet  5  . lisinopril (PRINIVIL,ZESTRIL) 5 MG tablet TAKE 1 TABLET BY MOUTH EVERY DAY  30 tablet  6  . meclizine (ANTIVERT) 25 MG tablet Take 25 mg by mouth 3 (three) times daily as needed.      . nitroGLYCERIN (NITROSTAT) 0.4 MG SL tablet Place 1 tablet (0.4 mg total) under the tongue every 5 (five) minutes as needed.  25 tablet  3  . simvastatin (ZOCOR) 40 MG tablet TAKE 1 TABLET BY MOUTH AT BEDTIME  30 tablet  6  . spironolactone (ALDACTONE) 25 MG tablet TAKE 1/2 TABLET BY MOUTH EVERY DAY  15 tablet  6   No current facility-administered medications for this visit.    Past Medical History  Diagnosis Date  . Essential hypertension, benign   . Vertigo   . Dementia     Mild  . Hyperlipidemia   . Post-infarction apical thrombus     Resolved  . Coronary atherosclerosis of native coronary artery     BMS LAD 4/11, residual RCA 70-80% mid  . Ischemic cardiomyopathy     LVEF up to 55% 11/13  . STEMI (ST  elevation myocardial infarction)     Anterior 4/11    Social History Yolanda Gibson reports that she has never smoked. She has never used smokeless tobacco. Yolanda Gibson reports that she does not drink alcohol.  Review of Systems Stable appetite. No bleeding problems. Complains of "nerve problems."  Otherwise negative.  Physical Examination Filed Vitals:   03/30/13 0907  BP: 134/75  Pulse: 66   Filed Weights   03/30/13 0907  Weight: 109 lb (49.442 kg)    Thin elderly woman, comfortable at rest.  HEENT: Conjunctiva and lids normal, oropharynx clear.  Neck: Supple, no elevated JVP or carotid bruits, no thyromegaly.  Lungs: Clear to auscultation, nonlabored breathing at rest.  Cardiac: Regular rate and rhythm, no S3, no pericardial rub.  Abdomen: Soft, nontender,bowel sounds present.  Extremities: No pitting edema, distal pulses 2+.  Skin: Warm and dry.  Musculoskeletal: No kyphosis.  Neuropsychiatric: Alert and oriented x3, affect grossly appropriate.   Problem List and Plan   Coronary atherosclerosis of native coronary artery Symptomatically stable on medical therapy. Continue current management.  DYSLIPIDEMIA Pending visit with Dr. Olena Leatherwood soon.  Essential hypertension, benign No change to current regimen.  Ischemic cardiomyopathy LVEF improved to 55% as of November 2013.    Jonelle Sidle, M.D., F.A.C.C.

## 2013-03-30 NOTE — Assessment & Plan Note (Signed)
LVEF improved to 55% as of November 2013. 

## 2013-03-30 NOTE — Assessment & Plan Note (Signed)
Symptomatically stable on medical therapy. Continue current management.

## 2013-03-30 NOTE — Patient Instructions (Addendum)

## 2013-04-10 DIAGNOSIS — I10 Essential (primary) hypertension: Secondary | ICD-10-CM | POA: Diagnosis not present

## 2013-04-10 DIAGNOSIS — M65839 Other synovitis and tenosynovitis, unspecified forearm: Secondary | ICD-10-CM | POA: Diagnosis not present

## 2013-04-10 DIAGNOSIS — E782 Mixed hyperlipidemia: Secondary | ICD-10-CM | POA: Diagnosis not present

## 2013-04-10 DIAGNOSIS — L570 Actinic keratosis: Secondary | ICD-10-CM | POA: Diagnosis not present

## 2013-06-03 DIAGNOSIS — Z880 Allergy status to penicillin: Secondary | ICD-10-CM | POA: Diagnosis not present

## 2013-06-03 DIAGNOSIS — M810 Age-related osteoporosis without current pathological fracture: Secondary | ICD-10-CM | POA: Diagnosis not present

## 2013-06-03 DIAGNOSIS — Z7902 Long term (current) use of antithrombotics/antiplatelets: Secondary | ICD-10-CM | POA: Diagnosis not present

## 2013-06-03 DIAGNOSIS — K625 Hemorrhage of anus and rectum: Secondary | ICD-10-CM | POA: Diagnosis not present

## 2013-06-03 DIAGNOSIS — Z8673 Personal history of transient ischemic attack (TIA), and cerebral infarction without residual deficits: Secondary | ICD-10-CM | POA: Diagnosis not present

## 2013-06-03 DIAGNOSIS — Z803 Family history of malignant neoplasm of breast: Secondary | ICD-10-CM | POA: Diagnosis not present

## 2013-06-03 DIAGNOSIS — I10 Essential (primary) hypertension: Secondary | ICD-10-CM | POA: Diagnosis not present

## 2013-06-03 DIAGNOSIS — H8110 Benign paroxysmal vertigo, unspecified ear: Secondary | ICD-10-CM | POA: Diagnosis not present

## 2013-06-03 DIAGNOSIS — Z883 Allergy status to other anti-infective agents status: Secondary | ICD-10-CM | POA: Diagnosis not present

## 2013-06-03 DIAGNOSIS — K921 Melena: Secondary | ICD-10-CM | POA: Diagnosis not present

## 2013-06-03 DIAGNOSIS — R109 Unspecified abdominal pain: Secondary | ICD-10-CM | POA: Diagnosis not present

## 2013-06-03 DIAGNOSIS — Z780 Asymptomatic menopausal state: Secondary | ICD-10-CM | POA: Diagnosis not present

## 2013-06-03 DIAGNOSIS — Z806 Family history of leukemia: Secondary | ICD-10-CM | POA: Diagnosis not present

## 2013-06-03 DIAGNOSIS — R42 Dizziness and giddiness: Secondary | ICD-10-CM

## 2013-06-03 DIAGNOSIS — Z7982 Long term (current) use of aspirin: Secondary | ICD-10-CM | POA: Diagnosis not present

## 2013-06-03 DIAGNOSIS — Z79899 Other long term (current) drug therapy: Secondary | ICD-10-CM | POA: Diagnosis not present

## 2013-06-03 DIAGNOSIS — K922 Gastrointestinal hemorrhage, unspecified: Secondary | ICD-10-CM | POA: Diagnosis not present

## 2013-06-03 DIAGNOSIS — Z2821 Immunization not carried out because of patient refusal: Secondary | ICD-10-CM | POA: Diagnosis not present

## 2013-06-03 DIAGNOSIS — Z96649 Presence of unspecified artificial hip joint: Secondary | ICD-10-CM | POA: Diagnosis not present

## 2013-06-04 DIAGNOSIS — K625 Hemorrhage of anus and rectum: Secondary | ICD-10-CM | POA: Diagnosis not present

## 2013-06-04 DIAGNOSIS — I10 Essential (primary) hypertension: Secondary | ICD-10-CM | POA: Diagnosis not present

## 2013-06-04 DIAGNOSIS — K922 Gastrointestinal hemorrhage, unspecified: Secondary | ICD-10-CM | POA: Diagnosis not present

## 2013-06-04 DIAGNOSIS — M810 Age-related osteoporosis without current pathological fracture: Secondary | ICD-10-CM | POA: Diagnosis not present

## 2013-06-04 DIAGNOSIS — Z96649 Presence of unspecified artificial hip joint: Secondary | ICD-10-CM | POA: Diagnosis not present

## 2013-06-22 DIAGNOSIS — K922 Gastrointestinal hemorrhage, unspecified: Secondary | ICD-10-CM | POA: Diagnosis not present

## 2013-07-02 DIAGNOSIS — Z880 Allergy status to penicillin: Secondary | ICD-10-CM | POA: Diagnosis not present

## 2013-07-02 DIAGNOSIS — Z7982 Long term (current) use of aspirin: Secondary | ICD-10-CM | POA: Diagnosis not present

## 2013-07-02 DIAGNOSIS — K922 Gastrointestinal hemorrhage, unspecified: Secondary | ICD-10-CM | POA: Diagnosis not present

## 2013-07-02 DIAGNOSIS — I10 Essential (primary) hypertension: Secondary | ICD-10-CM | POA: Diagnosis not present

## 2013-07-02 DIAGNOSIS — M810 Age-related osteoporosis without current pathological fracture: Secondary | ICD-10-CM | POA: Diagnosis not present

## 2013-07-02 DIAGNOSIS — Z8673 Personal history of transient ischemic attack (TIA), and cerebral infarction without residual deficits: Secondary | ICD-10-CM | POA: Diagnosis not present

## 2013-07-02 DIAGNOSIS — K648 Other hemorrhoids: Secondary | ICD-10-CM | POA: Diagnosis not present

## 2013-07-02 DIAGNOSIS — Z7902 Long term (current) use of antithrombotics/antiplatelets: Secondary | ICD-10-CM | POA: Diagnosis not present

## 2013-07-02 DIAGNOSIS — Z79899 Other long term (current) drug therapy: Secondary | ICD-10-CM | POA: Diagnosis not present

## 2013-07-02 DIAGNOSIS — Z881 Allergy status to other antibiotic agents status: Secondary | ICD-10-CM | POA: Diagnosis not present

## 2013-07-02 DIAGNOSIS — Z96649 Presence of unspecified artificial hip joint: Secondary | ICD-10-CM | POA: Diagnosis not present

## 2013-07-06 ENCOUNTER — Other Ambulatory Visit: Payer: Self-pay | Admitting: *Deleted

## 2013-07-06 MED ORDER — SPIRONOLACTONE 25 MG PO TABS: 12.5000 mg | ORAL_TABLET | Freq: Every day | ORAL | Status: DC

## 2013-07-06 MED ORDER — CARVEDILOL 6.25 MG PO TABS: 6.2500 mg | ORAL_TABLET | Freq: Two times a day (BID) | ORAL | Status: DC

## 2013-07-06 MED ORDER — LISINOPRIL 5 MG PO TABS: 5.0000 mg | ORAL_TABLET | Freq: Every day | ORAL | Status: DC

## 2013-07-10 DIAGNOSIS — I10 Essential (primary) hypertension: Secondary | ICD-10-CM | POA: Diagnosis not present

## 2013-08-26 ENCOUNTER — Other Ambulatory Visit: Payer: Self-pay | Admitting: Cardiology

## 2013-08-26 MED ORDER — SIMVASTATIN 40 MG PO TABS: ORAL_TABLET | ORAL | Status: DC

## 2013-10-09 ENCOUNTER — Encounter: Payer: Self-pay | Admitting: Cardiology

## 2013-10-09 ENCOUNTER — Ambulatory Visit (INDEPENDENT_AMBULATORY_CARE_PROVIDER_SITE_OTHER): Payer: Medicare Other | Admitting: Cardiology

## 2013-10-09 VITALS — BP 150/74 | HR 72 | Ht <= 58 in | Wt 108.0 lb

## 2013-10-09 DIAGNOSIS — E785 Hyperlipidemia, unspecified: Secondary | ICD-10-CM | POA: Diagnosis not present

## 2013-10-09 DIAGNOSIS — I2510 Atherosclerotic heart disease of native coronary artery without angina pectoris: Secondary | ICD-10-CM

## 2013-10-09 NOTE — Progress Notes (Signed)
Clinical Summary Ms. Yolanda Gibson is an 80 y.o.female last seen in September 2014. [facility], ultimately findings of large hemorrhoids by colonoscopy. Plavix was held at that time and she has not resumed it.  She is here with her son today. She denies any chest pain or unusual shortness of breath. She uses a walker to ambulate. No obvious hematochezia.  Lab work from September 2014 showed BUN 17, creatinine 0.8, normal AST and ALT, potassium 4.3, hemoglobin 13.1, platelets 144. She states she will be seeing Dr. Olena LeatherwoodHasanaj soon and have labwork.   Allergies  Allergen Reactions  . Penicillins     REACTION: rash    Current Outpatient Prescriptions  Medication Sig Dispense Refill  . aspirin 81 MG tablet Take 81 mg by mouth daily.        . calcium-vitamin D (OSCAL WITH D) 500-200 MG-UNIT per tablet Take 1 tablet by mouth daily with breakfast.      . carvedilol (COREG) 6.25 MG tablet Take 1 tablet (6.25 mg total) by mouth 2 (two) times daily with a meal.  60 tablet  6  . lisinopril (PRINIVIL,ZESTRIL) 5 MG tablet Take 1 tablet (5 mg total) by mouth daily.  30 tablet  6  . meclizine (ANTIVERT) 25 MG tablet Take 25 mg by mouth 3 (three) times daily as needed.      . nitroGLYCERIN (NITROSTAT) 0.4 MG SL tablet Place 1 tablet (0.4 mg total) under the tongue every 5 (five) minutes as needed.  25 tablet  3  . simvastatin (ZOCOR) 40 MG tablet TAKE 1 TABLET BY MOUTH AT BEDTIME  30 tablet  6  . spironolactone (ALDACTONE) 25 MG tablet Take 0.5 tablets (12.5 mg total) by mouth daily.  15 tablet  6   No current facility-administered medications for this visit.    Past Medical History  Diagnosis Date  . Essential hypertension, benign   . Vertigo   . Dementia     Mild  . Hyperlipidemia   . Post-infarction apical thrombus     Resolved  . Coronary atherosclerosis of native coronary artery     BMS LAD 4/11, residual RCA 70-80% mid  .  Ischemic cardiomyopathy     LVEF up to 55% 11/13  . STEMI (ST elevation myocardial infarction)     Anterior 4/11    Social History Ms. Yolanda Gibson reports that she has never smoked. She has never used smokeless tobacco. Ms. Yolanda Gibson reports that she does not drink alcohol.  Review of Systems No palpitations, dizziness, syncope. No leg edema. No orthopnea.  Physical Examination Filed Vitals:   10/09/13 1101  BP: 150/74  Pulse: 72   Filed Weights   10/09/13 1101  Weight: 108 lb (48.988 kg)    Thin elderly woman, comfortable at rest.  HEENT: Conjunctiva and lids normal, oropharynx clear.  Neck: Supple, no elevated JVP or carotid bruits, no thyromegaly.  Lungs: Clear to auscultation, nonlabored breathing at rest.  Cardiac: Regular rate and rhythm, no S3, no pericardial rub.  Abdomen: Soft, nontender,bowel sounds present.  Extremities: No pitting edema, distal pulses 2+.  Skin: Warm and dry.  Musculoskeletal: No kyphosis.  Neuropsychiatric: Alert and oriented x3, affect grossly appropriate.   Problem List and Plan   Coronary atherosclerosis of native coronary artery Symptomatically stable medical therapy. Would discontinue Plavix altogether from her regimen and stay on aspirin. She is status post BMS to the LAD in 2011. Office followup arranged.  DYSLIPIDEMIA  She continues on Zocor. Needs followup lipid panel with Dr. Olena Leatherwood.    Jonelle Sidle, M.D., F.A.C.C.

## 2013-10-09 NOTE — Patient Instructions (Signed)
   Stop Plavix  Continue the Aspirin 81mg  daily Continue all other medications.   Your physician wants you to follow up in: 6 months.  You will receive a reminder letter in the mail one-two months in advance.  If you don't receive a letter, please call our office to schedule the follow up appointment

## 2013-10-09 NOTE — Assessment & Plan Note (Signed)
Symptomatically stable medical therapy. Would discontinue Plavix altogether from her regimen and stay on aspirin. She is status post BMS to the LAD in 2011. Office followup arranged.

## 2013-10-09 NOTE — Assessment & Plan Note (Signed)
She continues on Zocor. Needs followup lipid panel with Dr. Olena LeatherwoodHasanaj.

## 2013-12-08 DIAGNOSIS — E782 Mixed hyperlipidemia: Secondary | ICD-10-CM | POA: Diagnosis not present

## 2013-12-08 DIAGNOSIS — I10 Essential (primary) hypertension: Secondary | ICD-10-CM | POA: Diagnosis not present

## 2014-02-08 ENCOUNTER — Other Ambulatory Visit: Payer: Self-pay | Admitting: *Deleted

## 2014-02-08 MED ORDER — CARVEDILOL 6.25 MG PO TABS: 6.2500 mg | ORAL_TABLET | Freq: Two times a day (BID) | ORAL | Status: DC

## 2014-02-08 MED ORDER — LISINOPRIL 5 MG PO TABS: 5.0000 mg | ORAL_TABLET | Freq: Every day | ORAL | Status: DC

## 2014-03-09 DIAGNOSIS — I10 Essential (primary) hypertension: Secondary | ICD-10-CM | POA: Diagnosis not present

## 2014-04-12 ENCOUNTER — Encounter: Payer: Self-pay | Admitting: Cardiology

## 2014-04-12 ENCOUNTER — Ambulatory Visit (INDEPENDENT_AMBULATORY_CARE_PROVIDER_SITE_OTHER): Payer: Medicare Other | Admitting: Cardiology

## 2014-04-12 VITALS — BP 155/84 | HR 68 | Ht 61.0 in | Wt 110.4 lb

## 2014-04-12 DIAGNOSIS — E785 Hyperlipidemia, unspecified: Secondary | ICD-10-CM

## 2014-04-12 DIAGNOSIS — I2510 Atherosclerotic heart disease of native coronary artery without angina pectoris: Secondary | ICD-10-CM | POA: Diagnosis not present

## 2014-04-12 MED ORDER — NITROGLYCERIN 0.4 MG SL SUBL: 0.4000 mg | SUBLINGUAL_TABLET | SUBLINGUAL | Status: DC | PRN

## 2014-04-12 NOTE — Assessment & Plan Note (Signed)
She is concerned about possible side effects related to Zocor. I have asked her to hold the medicine for a least 2 weeks to see if her symptoms improve. If so, she will let us know, otherwise plan to resume Zocor.

## 2014-04-12 NOTE — Patient Instructions (Signed)
Your physician recommends that you schedule a follow-up appointment in: 6 months. You will receive a reminder letter in the mail in about 4 months reminding you to call and schedule your appointment. If you don't receive this letter, please contact our office. Your physician recommends that you continue on your current medications as directed. Please refer to the Current Medication list given to you today. Please hold your simvastatin for a couple of weeks; then call our office with an update on your symptoms.

## 2014-04-12 NOTE — Assessment & Plan Note (Signed)
Symptomatically stable medical therapy. No changes made today. 

## 2014-04-12 NOTE — Progress Notes (Signed)
Clinical Summary Ms. Shinault is an 80 y.o.female last seen in January. She is here with her son. She reports no angina symptoms or unusual shortness of breath with activity. She continues to use a walker, has had no falls.  We reviewed her medications. She has been having some intermittent leg cramps and was wondering if it might be related to Zocor. We discussed a drug holiday to see if we need to make any modifications.   Allergies  Allergen Reactions  . Penicillins     REACTION: rash    Current Outpatient Prescriptions  Medication Sig Dispense Refill  . aspirin 81 MG tablet Take 81 mg by mouth daily.        . calcium-vitamin D (OSCAL WITH D) 500-200 MG-UNIT per tablet Take 1 tablet by mouth daily with breakfast.      . carvedilol (COREG) 6.25 MG tablet Take 1 tablet (6.25 mg total) by mouth 2 (two) times daily with a meal.  60 tablet  6  . clopidogrel (PLAVIX) 75 MG tablet Take 75 mg by mouth daily.      Marland Kitchen lisinopril (PRINIVIL,ZESTRIL) 5 MG tablet Take 1 tablet (5 mg total) by mouth daily.  30 tablet  6  . meclizine (ANTIVERT) 25 MG tablet Take 25 mg by mouth 3 (three) times daily as needed.      . nitroGLYCERIN (NITROSTAT) 0.4 MG SL tablet Place 1 tablet (0.4 mg total) under the tongue every 5 (five) minutes as needed. Up to 3 doses. If no relief after the 3 rd dose, proceed to the ED for an evaluation.  25 tablet  3  . simvastatin (ZOCOR) 40 MG tablet TAKE 1 TABLET BY MOUTH AT BEDTIME  30 tablet  6  . spironolactone (ALDACTONE) 25 MG tablet Take 0.5 tablets (12.5 mg total) by mouth daily.  15 tablet  6   No current facility-administered medications for this visit.    Past Medical History  Diagnosis Date  . Essential hypertension, benign   . Vertigo   . Dementia     Mild  . Hyperlipidemia   . Post-infarction apical thrombus     Resolved  . Coronary atherosclerosis of native coronary artery     BMS LAD 4/11, residual RCA 70-80% mid  . Ischemic cardiomyopathy     LVEF up  to 55% 11/13  . STEMI (ST elevation myocardial infarction)     Anterior 4/11    Social History Ms. Bertoni reports that she has never smoked. She has never used smokeless tobacco. Ms. Sarti reports that she does not drink alcohol.  Review of Systems No palpitations or syncope. Stable appetite. No bleeding episodes. Other systems reviewed and negative except as outlined.  Physical Examination Filed Vitals:   04/12/14 1414  BP: 155/84  Pulse: 68   Filed Weights   04/12/14 1414  Weight: 110 lb 6.4 oz (50.077 kg)    Thin elderly woman, comfortable at rest.  HEENT: Conjunctiva and lids normal, oropharynx clear.  Neck: Supple, no elevated JVP or carotid bruits, no thyromegaly.  Lungs: Clear to auscultation, nonlabored breathing at rest.  Cardiac: Regular rate and rhythm, no S3, no pericardial rub.  Abdomen: Soft, nontender,bowel sounds present.  Extremities: No pitting edema, distal pulses 2+.  Skin: Warm and dry.  Musculoskeletal: No kyphosis.  Neuropsychiatric: Alert and oriented x3, affect grossly appropriate.   Problem List and Plan   Coronary atherosclerosis of native coronary artery Symptomatically stable medical therapy. No changes made today.  DYSLIPIDEMIA  She is concerned about possible side effects related to Zocor. I have asked her to hold the medicine for a least 2 weeks to see if her symptoms improve. If so, she will let us know, otherwise plan to resume Zocor.    Jonelle SidleSamuel G. McDowell, M.D., F.A.C.C.

## 2014-06-08 DIAGNOSIS — I10 Essential (primary) hypertension: Secondary | ICD-10-CM | POA: Diagnosis not present

## 2014-08-10 ENCOUNTER — Other Ambulatory Visit: Payer: Self-pay | Admitting: *Deleted

## 2014-08-10 MED ORDER — NITROGLYCERIN 0.4 MG SL SUBL: 0.4000 mg | SUBLINGUAL_TABLET | SUBLINGUAL | Status: DC | PRN

## 2014-09-07 ENCOUNTER — Other Ambulatory Visit: Payer: Self-pay | Admitting: *Deleted

## 2014-09-07 ENCOUNTER — Other Ambulatory Visit: Payer: Self-pay | Admitting: Cardiology

## 2014-09-07 DIAGNOSIS — I10 Essential (primary) hypertension: Secondary | ICD-10-CM | POA: Diagnosis not present

## 2014-09-07 MED ORDER — LISINOPRIL 5 MG PO TABS: 5.0000 mg | ORAL_TABLET | Freq: Every day | ORAL | Status: DC

## 2014-10-26 ENCOUNTER — Encounter: Payer: Self-pay | Admitting: Cardiology

## 2014-10-26 ENCOUNTER — Ambulatory Visit (INDEPENDENT_AMBULATORY_CARE_PROVIDER_SITE_OTHER): Payer: Medicare Other | Admitting: Cardiology

## 2014-10-26 VITALS — BP 142/70 | HR 74 | Ht 61.0 in | Wt 112.0 lb

## 2014-10-26 DIAGNOSIS — I10 Essential (primary) hypertension: Secondary | ICD-10-CM | POA: Diagnosis not present

## 2014-10-26 DIAGNOSIS — I2510 Atherosclerotic heart disease of native coronary artery without angina pectoris: Secondary | ICD-10-CM | POA: Diagnosis not present

## 2014-10-26 DIAGNOSIS — E782 Mixed hyperlipidemia: Secondary | ICD-10-CM

## 2014-10-26 DIAGNOSIS — I255 Ischemic cardiomyopathy: Secondary | ICD-10-CM | POA: Diagnosis not present

## 2014-10-26 NOTE — Progress Notes (Signed)
Reason for visit: CAD  Clinical Summary Yolanda Gibson is an 81 y.o.female last seen in July 2015. She is here with her son today for a routine visit. She does not report any angina symptoms or nitroglycerin use. She is functionally limited with previous stroke and arthritic pains at baseline, although does use a walker to get around the house and for other chores. She denies any falls. She continues on the medications outlined below including DAPT, beta blocker, ACE inhibitor, and Zocor.  We had her stop Zocor temporarily at time of her last visit to see if it would with leg cramping, it did not change things. She has continued on the medication.  She has a follow-up visit with Dr. Olena Leatherwood for lab work in the next month.  ECG today shows normal sinus rhythm with left atrial enlargement and low voltage.   Allergies  Allergen Reactions  . Penicillins     REACTION: rash    Current Outpatient Prescriptions  Medication Sig Dispense Refill  . aspirin 81 MG tablet Take 81 mg by mouth daily.      . calcium-vitamin D (OSCAL WITH D) 500-200 MG-UNIT per tablet Take 1 tablet by mouth daily with breakfast.    . carvedilol (COREG) 6.25 MG tablet TAKE 1 TABLET (6.25 MG TOTAL) BY MOUTH 2 (TWO) TIMES DAILY WITH A MEAL. 60 tablet 6  . clopidogrel (PLAVIX) 75 MG tablet Take 75 mg by mouth daily.    Marland Kitchen lisinopril (PRINIVIL,ZESTRIL) 5 MG tablet Take 1 tablet (5 mg total) by mouth daily. 30 tablet 6  . meclizine (ANTIVERT) 25 MG tablet Take 25 mg by mouth 3 (three) times daily as needed.    . nitroGLYCERIN (NITROSTAT) 0.4 MG SL tablet Place 1 tablet (0.4 mg total) under the tongue every 5 (five) minutes as needed. Up to 3 doses. If no relief after the 3 rd dose, proceed to the ED for an evaluation. 25 tablet 3  . simvastatin (ZOCOR) 40 MG tablet TAKE 1 TABLET BY MOUTH AT BEDTIME 30 tablet 6  . spironolactone (ALDACTONE) 25 MG tablet Take 0.5 tablets (12.5 mg total) by mouth daily. 15 tablet 6   No current  facility-administered medications for this visit.    Past Medical History  Diagnosis Date  . Essential hypertension, benign   . Vertigo   . Dementia     Mild  . Hyperlipidemia   . Post-infarction apical thrombus     Resolved  . Coronary atherosclerosis of native coronary artery     BMS LAD 4/11, residual RCA 70-80% mid  . Ischemic cardiomyopathy     LVEF up to 55% 11/13  . STEMI (ST elevation myocardial infarction)     Anterior 4/11    Social History Ms. Kellett reports that she has never smoked. She has never used smokeless tobacco. Ms. Caporaso reports that she does not drink alcohol.  Review of Systems Complete review of systems negative except as otherwise outlined in the clinical summary and also the following. No spontaneous bleeding episodes. Stable appetite.  Physical Examination Filed Vitals:   10/26/14 1403  BP: 142/70  Pulse: 74    Wt Readings from Last 3 Encounters:  10/26/14 112 lb (50.803 kg)  04/12/14 110 lb 6.4 oz (50.077 kg)  10/09/13 108 lb (48.988 kg)   Thin elderly woman, appears comfortable at rest.  HEENT: Conjunctiva and lids normal, oropharynx clear.  Neck: Supple, no elevated JVP or carotid bruits, no thyromegaly.  Lungs: Clear to auscultation, nonlabored breathing  at rest.  Cardiac: Regular rate and rhythm, no S3, no pericardial rub.  Abdomen: Soft, nontender,bowel sounds present.  Extremities: No pitting edema, distal pulses 2+.  Skin: Warm and dry.  Musculoskeletal: No kyphosis.  Neuropsychiatric: Alert and oriented x3, affect grossly appropriate.   Problem List and Plan   Coronary atherosclerosis of native coronary artery Symptomatically stable on medical therapy status post BMS to the LAD in 2011. ECG reviewed and stable. Our plan is to continue medical therapy and observation at this time. She continues on DAPT with concurrent history of stroke.   Ischemic cardiomyopathy LVEF improved to 55% as of November 2013.   Mixed  hyperlipidemia She continues on Zocor. Keep follow-up with Dr. Olena LeatherwoodHasanaj for lab work.     Jonelle SidleSamuel G. Delvecchio Madole, M.D., F.A.C.C.

## 2014-10-26 NOTE — Patient Instructions (Signed)

## 2014-10-26 NOTE — Assessment & Plan Note (Signed)
She continues on Zocor. Keep follow-up with Dr. Olena LeatherwoodHasanaj for lab work.

## 2014-10-26 NOTE — Assessment & Plan Note (Signed)
Symptomatically stable on medical therapy status post BMS to the LAD in 2011. ECG reviewed and stable. Our plan is to continue medical therapy and observation at this time. She continues on DAPT with concurrent history of stroke.

## 2014-10-26 NOTE — Assessment & Plan Note (Signed)
LVEF improved to 55% as of November 2013.

## 2014-12-07 DIAGNOSIS — E784 Other hyperlipidemia: Secondary | ICD-10-CM | POA: Diagnosis not present

## 2014-12-07 DIAGNOSIS — Z1389 Encounter for screening for other disorder: Secondary | ICD-10-CM | POA: Diagnosis not present

## 2014-12-07 DIAGNOSIS — I10 Essential (primary) hypertension: Secondary | ICD-10-CM | POA: Diagnosis not present

## 2014-12-07 DIAGNOSIS — Z0000 Encounter for general adult medical examination without abnormal findings: Secondary | ICD-10-CM | POA: Diagnosis not present

## 2014-12-16 DIAGNOSIS — Z1231 Encounter for screening mammogram for malignant neoplasm of breast: Secondary | ICD-10-CM | POA: Diagnosis not present

## 2015-01-21 ENCOUNTER — Other Ambulatory Visit: Payer: Self-pay | Admitting: *Deleted

## 2015-01-21 MED ORDER — LISINOPRIL 5 MG PO TABS: 5.0000 mg | ORAL_TABLET | Freq: Every day | ORAL | Status: DC

## 2015-03-08 DIAGNOSIS — E784 Other hyperlipidemia: Secondary | ICD-10-CM | POA: Diagnosis not present

## 2015-03-08 DIAGNOSIS — I10 Essential (primary) hypertension: Secondary | ICD-10-CM | POA: Diagnosis not present

## 2015-03-29 ENCOUNTER — Other Ambulatory Visit: Payer: Self-pay | Admitting: Cardiology

## 2015-04-21 ENCOUNTER — Ambulatory Visit (INDEPENDENT_AMBULATORY_CARE_PROVIDER_SITE_OTHER): Payer: Medicare Other | Admitting: Cardiology

## 2015-04-21 ENCOUNTER — Encounter: Payer: Self-pay | Admitting: Cardiology

## 2015-04-21 ENCOUNTER — Encounter: Payer: Self-pay | Admitting: *Deleted

## 2015-04-21 VITALS — BP 110/64 | HR 71 | Ht 61.0 in | Wt 111.0 lb

## 2015-04-21 DIAGNOSIS — I10 Essential (primary) hypertension: Secondary | ICD-10-CM

## 2015-04-21 DIAGNOSIS — I255 Ischemic cardiomyopathy: Secondary | ICD-10-CM

## 2015-04-21 DIAGNOSIS — E782 Mixed hyperlipidemia: Secondary | ICD-10-CM

## 2015-04-21 DIAGNOSIS — I2510 Atherosclerotic heart disease of native coronary artery without angina pectoris: Secondary | ICD-10-CM | POA: Diagnosis not present

## 2015-04-21 NOTE — Patient Instructions (Signed)
Continue all current medications. Your physician wants you to follow up in: 6 months.  You will receive a reminder letter in the mail one-two months in advance.  If you don't receive a letter, please call our office to schedule the follow up appointment   

## 2015-04-21 NOTE — Progress Notes (Signed)
Cardiology Office Note  Date: 04/21/2015   ID: Yolanda Gibson, Yolanda Gibson [birth year], MRN [id number]  PCP: Toma Deiters, MD  Primary Cardiologist: [name], MD   Chief Complaint  Patient presents with  . Coronary Artery Disease  . Hyperlipidemia    History of Present Illness: Yolanda Gibson is an 81 y.o. female last seen in January 2016. She is here with her son today for a follow-up visit. No significant angina symptoms reported, she remains functional in her ADLs. Uses a cane to ambulate, no recent falls.  We reviewed her cardiac medications which are outlined below and stable. She reports no obvious side effects.  She continues to follow with Dr. Olena Leatherwood and had lab work since I last saw her. We will request this for review.   Past Medical History  Diagnosis Date  . Essential hypertension, benign   . Vertigo   . Dementia     Mild  . Hyperlipidemia   . Post-infarction apical thrombus     Resolved  . Coronary atherosclerosis of native coronary artery     BMS LAD 4/11, residual RCA 70-80% mid  . Ischemic cardiomyopathy     LVEF up to 55% 11/13  . STEMI (ST elevation myocardial infarction)     Anterior 4/11    Current Outpatient Prescriptions  Medication Sig Dispense Refill  . aspirin 81 MG tablet Take 81 mg by mouth daily.      . calcium-vitamin D (OSCAL WITH D) 500-200 MG-UNIT per tablet Take 1 tablet by mouth daily with breakfast.    . carvedilol (COREG) 6.25 MG tablet TAKE 1 TABLET (6.25 MG TOTAL) BY MOUTH 2 (TWO) TIMES DAILY WITH A MEAL. 60 tablet 6  . clopidogrel (PLAVIX) 75 MG tablet Take 75 mg by mouth daily.    Marland Kitchen lisinopril (PRINIVIL,ZESTRIL) 5 MG tablet Take 1 tablet (5 mg total) by mouth daily. 90 tablet 3  . meclizine (ANTIVERT) 25 MG tablet Take 25 mg by mouth 3 (three) times daily as needed.    . nitroGLYCERIN (NITROSTAT) 0.4 MG SL tablet Place 1 tablet (0.4 mg total) under the tongue every 5 (five) minutes as needed. Up to 3 doses. If no  relief after the 3 rd dose, proceed to the ED for an evaluation. 25 tablet 3  . simvastatin (ZOCOR) 40 MG tablet TAKE 1 TABLET BY MOUTH AT BEDTIME 30 tablet 6  . spironolactone (ALDACTONE) 25 MG tablet Take 0.5 tablets (12.5 mg total) by mouth daily. 15 tablet 6   No current facility-administered medications for this visit.    Allergies:  Penicillins   Social History: The patient  reports that she has never smoked. She has never used smokeless tobacco. She reports that she does not drink alcohol or use illicit drugs.   ROS:  Please see the history of present illness. Otherwise, complete review of systems is positive for none.  All other systems are reviewed and negative.   Physical Exam: VS:  BP 110/64 mmHg  Pulse 71  Ht  (1.549 m)  Wt 111 lb (50.349 kg)  BMI 20.98 kg/m2  SpO2 97%, BMI Body mass index is 20.98 kg/(m^2).  Wt Readings from Last 3 Encounters:  04/21/15 111 lb (50.349 kg)  10/26/14 112 lb (50.803 kg)  04/12/14 110 lb 6.4 oz (50.077 kg)    Thin elderly woman, appears comfortable at rest.  HEENT: Conjunctiva and lids normal, oropharynx clear.  Neck: Supple, no elevated JVP or carotid bruits, no thyromegaly.  Lungs: Clear to auscultation, nonlabored breathing at rest.  Cardiac: Regular rate and rhythm, no S3, no pericardial rub.  Abdomen: Soft, nontender,bowel sounds present.  Extremities: No pitting edema, distal pulses 2+.  Skin: Warm and dry.  Musculoskeletal: No kyphosis.  Neuropsychiatric: Alert and oriented x3, affect grossly appropriate.   ECG: ECG is not ordered today.   Assessment and Plan:  1. Symptomatically stable CAD status post BMS to the LAD in 2011 with moderate residual RCA disease that has been managed medically. She continues to do very well on medical therapy, no changes anticipated today.  2. Hyperlipidemia, on statin therapy. Will request most recent lab work from Dr. Olena Leatherwood.  3. Essential hypertension, blood pressure is  normal today.  Current medicines were reviewed with the patient today.  Disposition: FU with me in 6 months.   Signed, Jonelle Sidle, MD, [facility] 04/21/2015 2:42 PM    [facility] at [facility] [address] [place], [place], [place] [address] Phone: [phone]; Fax: [phone]

## 2015-04-22 ENCOUNTER — Ambulatory Visit: Payer: Medicare Other | Admitting: Cardiology

## 2015-06-08 DIAGNOSIS — I10 Essential (primary) hypertension: Secondary | ICD-10-CM | POA: Diagnosis not present

## 2015-06-08 DIAGNOSIS — E784 Other hyperlipidemia: Secondary | ICD-10-CM | POA: Diagnosis not present

## 2015-06-08 DIAGNOSIS — I679 Cerebrovascular disease, unspecified: Secondary | ICD-10-CM | POA: Diagnosis not present

## 2015-06-08 DIAGNOSIS — I2510 Atherosclerotic heart disease of native coronary artery without angina pectoris: Secondary | ICD-10-CM | POA: Diagnosis not present

## 2015-09-08 DIAGNOSIS — Z131 Encounter for screening for diabetes mellitus: Secondary | ICD-10-CM | POA: Diagnosis not present

## 2015-09-08 DIAGNOSIS — E784 Other hyperlipidemia: Secondary | ICD-10-CM | POA: Diagnosis not present

## 2015-09-08 DIAGNOSIS — I10 Essential (primary) hypertension: Secondary | ICD-10-CM | POA: Diagnosis not present

## 2015-10-20 ENCOUNTER — Encounter: Payer: Self-pay | Admitting: Cardiology

## 2015-10-20 ENCOUNTER — Ambulatory Visit (INDEPENDENT_AMBULATORY_CARE_PROVIDER_SITE_OTHER): Payer: Medicare Other | Admitting: Cardiology

## 2015-10-20 ENCOUNTER — Ambulatory Visit: Payer: Medicare Other | Admitting: Cardiology

## 2015-10-20 ENCOUNTER — Encounter: Payer: Self-pay | Admitting: *Deleted

## 2015-10-20 VITALS — BP 117/69 | HR 63 | Ht 61.0 in | Wt 113.0 lb

## 2015-10-20 DIAGNOSIS — I2510 Atherosclerotic heart disease of native coronary artery without angina pectoris: Secondary | ICD-10-CM

## 2015-10-20 DIAGNOSIS — E782 Mixed hyperlipidemia: Secondary | ICD-10-CM | POA: Diagnosis not present

## 2015-10-20 DIAGNOSIS — I10 Essential (primary) hypertension: Secondary | ICD-10-CM

## 2015-10-20 NOTE — Progress Notes (Signed)
Cardiology Office Note  Date: 10/20/2015   ID: Yolanda Gibson, Reining [birth year], MRN [id number]  PCP: Toma Deiters, MD  Primary Cardiologist: [name], MD   Chief Complaint  Patient presents with  . Coronary Artery Disease    History of Present Illness: Yolanda Gibson is an 82 y.o. female last seen in July 2016. She is here today with her son for a follow-up visit. From a cardiac perspective, she reports no angina symptoms and NYHA class II dyspnea with low level activities. She uses a walker to do around her house. No reported falls or syncope.  We reviewed her medications together. Cardiac regimen includes aspirin, Plavix, Coreg, lisinopril, Zocor, Aldactone, and as needed nitroglycerin.  ECG today shows sinus rhythm with low voltage and poor R-wave progression anteriorly consistent with previous infarct.  Past Medical History  Diagnosis Date  . Essential hypertension, benign   . Vertigo   . Dementia     Mild  . Hyperlipidemia   . Post-infarction apical thrombus (HCC)     Resolved  . Coronary atherosclerosis of native coronary artery     BMS LAD 4/11, residual RCA 70-80% mid  . Ischemic cardiomyopathy     LVEF up to 55% 11/13  . STEMI (ST elevation myocardial infarction) [facility])     Anterior 4/11    Current Outpatient Prescriptions  Medication Sig Dispense Refill  . aspirin 81 MG tablet Take 81 mg by mouth daily.      . calcium-vitamin D (OSCAL WITH D) 500-200 MG-UNIT per tablet Take 1 tablet by mouth daily with breakfast.    . carvedilol (COREG) 6.25 MG tablet TAKE 1 TABLET (6.25 MG TOTAL) BY MOUTH 2 (TWO) TIMES DAILY WITH A MEAL. 60 tablet 6  . clopidogrel (PLAVIX) 75 MG tablet Take 75 mg by mouth daily.    Marland Kitchen lisinopril (PRINIVIL,ZESTRIL) 5 MG tablet Take 1 tablet (5 mg total) by mouth daily. 90 tablet 3  . meclizine (ANTIVERT) 25 MG tablet Take 25 mg by mouth 3 (three) times daily as needed.    . nitroGLYCERIN (NITROSTAT) 0.4 MG SL tablet Place 1  tablet (0.4 mg total) under the tongue every 5 (five) minutes as needed. Up to 3 doses. If no relief after the 3 rd dose, proceed to the ED for an evaluation. 25 tablet 3  . simvastatin (ZOCOR) 40 MG tablet TAKE 1 TABLET BY MOUTH AT BEDTIME 30 tablet 6  . spironolactone (ALDACTONE) 25 MG tablet Take 0.5 tablets (12.5 mg total) by mouth daily. 15 tablet 6   No current facility-administered medications for this visit.   Allergies:  Penicillins   Social History: The patient  reports that she has never smoked. She has never used smokeless tobacco. She reports that she does not drink alcohol or use illicit drugs.   ROS:  Please see the history of present illness. Otherwise, complete review of systems is positive for arthritic pains.  All other systems are reviewed and negative.   Physical Exam: VS:  BP 117/69 mmHg  Pulse 63  Ht  (1.549 m)  Wt 113 lb (51.256 kg)  BMI 21.36 kg/m2  SpO2 98%, BMI Body mass index is 21.36 kg/(m^2).  Wt Readings from Last 3 Encounters:  10/20/15 113 lb (51.256 kg)  04/21/15 111 lb (50.349 kg)  10/26/14 112 lb (50.803 kg)    Thin elderly woman, appears comfortable at rest.  HEENT: Conjunctiva and lids normal, oropharynx clear.  Neck: Supple, no elevated JVP or  carotid bruits, no thyromegaly.  Lungs: Clear to auscultation, nonlabored breathing at rest.  Cardiac: Regular rate and rhythm, no S3, no pericardial rub.  Abdomen: Soft, nontender,bowel sounds present.  Extremities: No pitting edema, distal pulses 2+.   ECG: ECG is ordered today.  Recent Labwork:  March 2016: Cholesterol 121, triglycerides 63, HDL 53, LDL 56, BUN 25, creatinine 0.7, potassium 4.4  Other Studies Reviewed Today:  Echocardiogram 08/27/2012: Study Conclusions  - Left ventricle: The cavity size was normal. Wall thickness was increased in a pattern of mild LVH. The estimated ejection fraction was 55%. Mild mid to apical anteroseptal hypokinesis. Doppler  parameters are consistent with abnormal left ventricular relaxation (grade 1 diastolic dysfunction). - Aortic valve: There was no stenosis. Valve area: 1.98cm^2(VTI). Valve area: 1.96cm^2 (Vmax). - Mitral valve: Trivial regurgitation. - Left atrium: The atrium was mildly dilated. - Right ventricle: The cavity size was normal. Systolic function was normal. - Pulmonary arteries: PA systolic pressure 28-32 mmHg. - Systemic veins: IVC measured 2.0 cm with normal respirophasic variation, suggesting RA pressure 6-10 mmHg. Impressions:  - Normal LV size with mild LV hypertrophy. EF 55%. There did appear to be mild mid to apical anteroseptal hypokinesis. Normal RV size and systolic function. No significant valvular abnormalities.  Assessment and Plan:  1. Symptomatically stable CAD status post previous anterior wall infarct, LVEF 55% by last evaluation. Plan to continue current medical therapy and follow-up.  2. Hyperlipidemia, on statin therapy. Requesting most recent lab work from Dr. Olena Leatherwood for review.  3. Essential hypertension, blood pressure control is good today.  Current medicines were reviewed with the patient today.   Orders Placed This Encounter  Procedures  . EKG 12-Lead    Disposition: FU with me in 6 months.   Signed, Jonelle Sidle, MD, [facility] 10/20/2015 2:57 PM    [facility] at [facility] [address] [place], [place], [place] [address] Phone: [phone]; Fax: [phone]

## 2015-10-20 NOTE — Patient Instructions (Signed)
Your physician recommends that you continue on your current medications as directed. Please refer to the Current Medication list given to you today. Your physician recommends that you schedule a follow-up appointment in: 6 months. You will receive a reminder letter in the mail in about 4 months reminding you to call and schedule your appointment. If you don't receive this letter, please contact our office. 

## 2015-11-05 ENCOUNTER — Other Ambulatory Visit: Payer: Self-pay | Admitting: Cardiology

## 2015-12-08 DIAGNOSIS — I10 Essential (primary) hypertension: Secondary | ICD-10-CM | POA: Diagnosis not present

## 2015-12-08 DIAGNOSIS — E784 Other hyperlipidemia: Secondary | ICD-10-CM | POA: Diagnosis not present

## 2015-12-08 DIAGNOSIS — Z23 Encounter for immunization: Secondary | ICD-10-CM | POA: Diagnosis not present

## 2015-12-08 DIAGNOSIS — Z0000 Encounter for general adult medical examination without abnormal findings: Secondary | ICD-10-CM | POA: Diagnosis not present

## 2015-12-08 DIAGNOSIS — Z1389 Encounter for screening for other disorder: Secondary | ICD-10-CM | POA: Diagnosis not present

## 2016-01-10 DIAGNOSIS — Z135 Encounter for screening for eye and ear disorders: Secondary | ICD-10-CM | POA: Diagnosis not present

## 2016-01-10 DIAGNOSIS — Z961 Presence of intraocular lens: Secondary | ICD-10-CM | POA: Diagnosis not present

## 2016-01-11 DIAGNOSIS — Z780 Asymptomatic menopausal state: Secondary | ICD-10-CM | POA: Diagnosis not present

## 2016-01-11 DIAGNOSIS — M8588 Other specified disorders of bone density and structure, other site: Secondary | ICD-10-CM | POA: Diagnosis not present

## 2016-01-11 DIAGNOSIS — E7800 Pure hypercholesterolemia, unspecified: Secondary | ICD-10-CM | POA: Diagnosis not present

## 2016-01-11 DIAGNOSIS — I10 Essential (primary) hypertension: Secondary | ICD-10-CM | POA: Diagnosis not present

## 2016-01-11 DIAGNOSIS — M810 Age-related osteoporosis without current pathological fracture: Secondary | ICD-10-CM | POA: Diagnosis not present

## 2016-01-11 DIAGNOSIS — I2510 Atherosclerotic heart disease of native coronary artery without angina pectoris: Secondary | ICD-10-CM | POA: Diagnosis not present

## 2016-01-11 DIAGNOSIS — Z79899 Other long term (current) drug therapy: Secondary | ICD-10-CM | POA: Diagnosis not present

## 2016-01-11 DIAGNOSIS — Z7982 Long term (current) use of aspirin: Secondary | ICD-10-CM | POA: Diagnosis not present

## 2016-01-18 ENCOUNTER — Other Ambulatory Visit: Payer: Self-pay | Admitting: Cardiology

## 2016-02-20 DIAGNOSIS — M810 Age-related osteoporosis without current pathological fracture: Secondary | ICD-10-CM | POA: Diagnosis not present

## 2016-03-09 DIAGNOSIS — M818 Other osteoporosis without current pathological fracture: Secondary | ICD-10-CM | POA: Diagnosis not present

## 2016-03-09 DIAGNOSIS — I10 Essential (primary) hypertension: Secondary | ICD-10-CM | POA: Diagnosis not present

## 2016-03-09 DIAGNOSIS — E784 Other hyperlipidemia: Secondary | ICD-10-CM | POA: Diagnosis not present

## 2016-04-25 NOTE — Progress Notes (Signed)
Cardiology Office Note  Date: 04/26/2016   ID: Tameesha, Zasada 01-Aug-1930, MRN [id number]  PCP: Toma Deiters, MD  Primary Cardiologist: [name], MD   Chief Complaint  Patient presents with  . Coronary Artery Disease    History of Present Illness: Yolanda Gibson is an 82 y.o. female last seen in January. She presents with her son for a routine follow-up visit. No reported angina symptoms or nitroglycerin use. Remains functional with basic ADLs, uses a walker, denies any falls.  I reviewed her medications. Cardiac regimen includes aspirin, Coreg, Plavix, lisinopril, Aldactone, and Zocor. She follows with Dr. Olena Leatherwood on a three-month basis.  Continue medical therapy and observation was symptomatically stable ischemic heart disease.  Past Medical History:  Diagnosis Date  . Coronary atherosclerosis of native coronary artery    BMS LAD 4/11, residual RCA 70-80% mid  . Dementia    Mild  . Essential hypertension, benign   . Hyperlipidemia   . Ischemic cardiomyopathy    LVEF up to 55% 11/13  . Post-infarction apical thrombus (HCC)    Resolved  . STEMI (ST elevation myocardial infarction) (HCC)    Anterior 4/11  . Vertigo     Current Outpatient Prescriptions  Medication Sig Dispense Refill  . aspirin 81 MG tablet Take 81 mg by mouth daily.      . calcium-vitamin D (OSCAL WITH D) 500-200 MG-UNIT per tablet Take 1 tablet by mouth daily with breakfast.    . carvedilol (COREG) 6.25 MG tablet TAKE 1 TABLET (6.25 MG TOTAL) BY MOUTH 2 (TWO) TIMES DAILY WITH A MEAL. 60 tablet 6  . clopidogrel (PLAVIX) 75 MG tablet Take 75 mg by mouth daily.    Marland Kitchen lisinopril (PRINIVIL,ZESTRIL) 5 MG tablet TAKE 1 TABLET (5 MG TOTAL) BY MOUTH DAILY. 90 tablet 3  . meclizine (ANTIVERT) 25 MG tablet Take 25 mg by mouth 3 (three) times daily as needed.    . nitroGLYCERIN (NITROSTAT) 0.4 MG SL tablet Place 1 tablet (0.4 mg total) under the tongue every 5 (five) minutes as needed. Up  to 3 doses. If no relief after the 3 rd dose, proceed to the ED for an evaluation. 25 tablet 3  . simvastatin (ZOCOR) 40 MG tablet TAKE 1 TABLET BY MOUTH AT BEDTIME 30 tablet 6  . spironolactone (ALDACTONE) 25 MG tablet Take 0.5 tablets (12.5 mg total) by mouth daily. 15 tablet 6   No current facility-administered medications for this visit.    Allergies:  Penicillins   Social History: The patient  reports that she has never smoked. She has never used smokeless tobacco. She reports that she does not drink alcohol or use drugs.   ROS:  Please see the history of present illness. Otherwise, complete review of systems is positive for gait instability, decreased hearing.  All other systems are reviewed and negative.   Physical Exam: VS:  BP 120/64   Pulse (!) 57   Ht 5\' 1"  (1.549 m)   Wt 118 lb (53.5 kg)   SpO2 98%   BMI 22.30 kg/m , BMI Body mass index is 22.3 kg/m.  Wt Readings from Last 3 Encounters:  04/26/16 118 lb (53.5 kg)  10/20/15 113 lb (51.3 kg)  04/21/15 111 lb (50.3 kg)    Thin elderly woman, appears comfortable at rest.  HEENT: Conjunctiva and lids normal, oropharynx clear.  Neck: Supple, no elevated JVP or carotid bruits, no thyromegaly.  Lungs: Clear to auscultation, nonlabored breathing at rest.  Cardiac: Regular rate and rhythm, no S3, no pericardial rub.  Abdomen: Soft, nontender,bowel sounds present.  Extremities: No pitting edema, distal pulses 2+.   ECG: I personally reviewed the tracing from 10/20/2015 which showed sinus rhythm with decreased R wave progression, rule out old anterior infarct pattern.  Recent Labwork:  December 2016: Cholesterol 125, triglycerides 62, HDL 52, LDL 61, hemoglobin A1c 5.6%  Other Studies Reviewed Today:  Echocardiogram 08/27/2012: Study Conclusions  - Left ventricle: The cavity size was normal. Wall thickness was increased in a pattern of mild LVH. The estimated ejection fraction was 55%. Mild mid to apical  anteroseptal hypokinesis. Doppler parameters are consistent with abnormal left ventricular relaxation (grade 1 diastolic dysfunction). - Aortic valve: There was no stenosis. Valve area: 1.98cm^2(VTI). Valve area: 1.96cm^2 (Vmax). - Mitral valve: Trivial regurgitation. - Left atrium: The atrium was mildly dilated. - Right ventricle: The cavity size was normal. Systolic function was normal. - Pulmonary arteries: PA systolic pressure 28-32 mmHg. - Systemic veins: IVC measured 2.0 cm with normal respirophasic variation, suggesting RA pressure 6-10 mmHg. Impressions:  - Normal LV size with mild LV hypertrophy. EF 55%. There did appear to be mild mid to apical anteroseptal hypokinesis. Normal RV size and systolic function. No significant valvular abnormalities.  Assessment and Plan:  1. Symptomatic stable CAD status post BMS to the LAD in 2011 with moderate residual RCA disease is being managed medically. LVEF 55%. Continue observation, no changes made to current regimen. Refill provided for new bottle of nitroglycerin.  2. Essential hypertension, blood pressure is well controlled today.  3. Hyperlipidemia, continues on statin therapy. Keep follow-up lab work with Dr. Olena Leatherwood.  Current medicines were reviewed with the patient today.  Disposition: Follow-up with me in 6 months.  Signed, Jonelle Sidle, MD, [facility] 04/26/2016 1:04 PM    [facility] at [facility] [address] [place], [place], [place] [address] Phone: [phone]; Fax: [phone]

## 2016-04-26 ENCOUNTER — Encounter: Payer: Self-pay | Admitting: Cardiology

## 2016-04-26 ENCOUNTER — Ambulatory Visit (INDEPENDENT_AMBULATORY_CARE_PROVIDER_SITE_OTHER): Payer: Medicare Other | Admitting: Cardiology

## 2016-04-26 VITALS — BP 120/64 | HR 57 | Ht 61.0 in | Wt 118.0 lb

## 2016-04-26 DIAGNOSIS — E782 Mixed hyperlipidemia: Secondary | ICD-10-CM

## 2016-04-26 DIAGNOSIS — I10 Essential (primary) hypertension: Secondary | ICD-10-CM

## 2016-04-26 DIAGNOSIS — I2510 Atherosclerotic heart disease of native coronary artery without angina pectoris: Secondary | ICD-10-CM | POA: Diagnosis not present

## 2016-04-26 DIAGNOSIS — I255 Ischemic cardiomyopathy: Secondary | ICD-10-CM | POA: Diagnosis not present

## 2016-04-26 MED ORDER — NITROGLYCERIN 0.4 MG SL SUBL: 0.4000 mg | SUBLINGUAL_TABLET | SUBLINGUAL | 3 refills | Status: AC | PRN

## 2016-04-26 NOTE — Patient Instructions (Signed)

## 2016-05-26 ENCOUNTER — Other Ambulatory Visit: Payer: Self-pay | Admitting: Cardiology

## 2016-06-08 DIAGNOSIS — I63132 Cerebral infarction due to embolism of left carotid artery: Secondary | ICD-10-CM | POA: Diagnosis not present

## 2016-06-08 DIAGNOSIS — M818 Other osteoporosis without current pathological fracture: Secondary | ICD-10-CM | POA: Diagnosis not present

## 2016-06-08 DIAGNOSIS — I10 Essential (primary) hypertension: Secondary | ICD-10-CM | POA: Diagnosis not present

## 2016-06-08 DIAGNOSIS — E784 Other hyperlipidemia: Secondary | ICD-10-CM | POA: Diagnosis not present

## 2016-09-14 DIAGNOSIS — M818 Other osteoporosis without current pathological fracture: Secondary | ICD-10-CM | POA: Diagnosis not present

## 2016-09-14 DIAGNOSIS — E784 Other hyperlipidemia: Secondary | ICD-10-CM | POA: Diagnosis not present

## 2016-09-14 DIAGNOSIS — I10 Essential (primary) hypertension: Secondary | ICD-10-CM | POA: Diagnosis not present

## 2016-10-18 DIAGNOSIS — M818 Other osteoporosis without current pathological fracture: Secondary | ICD-10-CM | POA: Diagnosis not present

## 2016-10-18 DIAGNOSIS — I10 Essential (primary) hypertension: Secondary | ICD-10-CM | POA: Diagnosis not present

## 2016-10-18 DIAGNOSIS — E784 Other hyperlipidemia: Secondary | ICD-10-CM | POA: Diagnosis not present

## 2016-10-31 NOTE — Progress Notes (Signed)
Cardiology Office Note  Date: 11/01/2016   ID: [name], [name] 01-23-1930, MRN [id number]  PCP: Yolanda Deiters, MD  Primary Cardiologist: Yolanda Dell, MD   Chief Complaint  Patient presents with  . Coronary Artery Disease    History of Present Illness: Yolanda Gibson is an 83 y.o. female last seen in July 2017. She is here today with her son for a follow-up visit. Continues to do well from a cardiac perspective, does not voice any angina symptoms or worsening shortness of breath. Her son looks in on her regularly. She reports compliance with her medications which are outlined below in unchanged from a cardiac perspective.  She continues to follow with Dr. Olena Gibson.  I reviewed her ECG today which shows normal sinus rhythm with old anterior infarct pattern. We are continuing conservative management of her CAD and she has done well on medical therapy.  Past Medical History:  Diagnosis Date  . Coronary atherosclerosis of native coronary artery    BMS LAD 4/11, residual RCA 70-80% mid  . Dementia    Mild  . Essential hypertension, benign   . Hyperlipidemia   . Ischemic cardiomyopathy    LVEF up to 55% 11/13  . Post-infarction apical thrombus (HCC)    Resolved  . STEMI (ST elevation myocardial infarction) (HCC)    Anterior 4/11  . Vertigo     Current Outpatient Prescriptions  Medication Sig Dispense Refill  . aspirin 81 MG tablet Take 81 mg by mouth daily.      . calcium-vitamin D (OSCAL WITH D) 500-200 MG-UNIT per tablet Take 1 tablet by mouth daily with breakfast.    . carvedilol (COREG) 6.25 MG tablet TAKE 1 TABLET (6.25 MG TOTAL) BY MOUTH 2 (TWO) TIMES DAILY WITH A MEAL. 60 tablet 6  . clopidogrel (PLAVIX) 75 MG tablet Take 75 mg by mouth daily.    Marland Kitchen lisinopril (PRINIVIL,ZESTRIL) 5 MG tablet TAKE 1 TABLET (5 MG TOTAL) BY MOUTH DAILY. 90 tablet 3  . meclizine (ANTIVERT) 25 MG tablet Take 25 mg by mouth 3 (three) times daily as needed.    . nitroGLYCERIN  (NITROSTAT) 0.4 MG SL tablet Place 1 tablet (0.4 mg total) under the tongue every 5 (five) minutes as needed. Up to 3 doses. If no relief after the 3 rd dose, proceed to the ED for an evaluation. 25 tablet 3  . simvastatin (ZOCOR) 40 MG tablet TAKE 1 TABLET BY MOUTH AT BEDTIME 30 tablet 6  . spironolactone (ALDACTONE) 25 MG tablet Take 0.5 tablets (12.5 mg total) by mouth daily. 15 tablet 6   No current facility-administered medications for this visit.    Allergies:  Penicillins   Social History: The patient  reports that she has never smoked. She has never used smokeless tobacco. She reports that she does not drink alcohol or use drugs.   ROS:  Please see the history of present illness. Otherwise, complete review of systems is positive for hearing loss.  All other systems are reviewed and negative.   Physical Exam: VS:  BP 123/71   Pulse 66   Ht 5\' 1"  (1.549 m)   Wt 116 lb 9.6 oz (52.9 kg)   SpO2 99%   BMI 22.03 kg/m , BMI Body mass index is 22.03 kg/m.  Wt Readings from Last 3 Encounters:  11/01/16 116 lb 9.6 oz (52.9 kg)  04/26/16 118 lb (53.5 kg)  10/20/15 113 lb (51.3 kg)    Thin elderly woman, appears comfortable  at rest.  HEENT: Conjunctiva and lids normal, oropharynx clear.  Neck: Supple, no elevated JVP or carotid bruits, no thyromegaly.  Lungs: Clear to auscultation, nonlabored breathing at rest.  Cardiac: Regular rate and rhythm, no S3, no pericardial rub.  Abdomen: Soft, nontender,bowel sounds present.  Extremities: No pitting edema, distal pulses 2+.   ECG: I personally reviewed the tracing from 10/20/2015 which showed sinus rhythm with poor R-wave progression.  Recent Labwork:  December 2016: Cholesterol 125, triglycerides 62, HDL 52, LDL 61, hemoglobin A1c 5.6%  Other Studies Reviewed Today:  Echocardiogram 08/27/2012: Study Conclusions  - Left ventricle: The cavity size was normal. Wall thickness was increased in a pattern of mild LVH. The  estimated ejection fraction was 55%. Mild mid to apical anteroseptal hypokinesis. Doppler parameters are consistent with abnormal left ventricular relaxation (grade 1 diastolic dysfunction). - Aortic valve: There was no stenosis. Valve area: 1.98cm^2(VTI). Valve area: 1.96cm^2 (Vmax). - Mitral valve: Trivial regurgitation. - Left atrium: The atrium was mildly dilated. - Right ventricle: The cavity size was normal. Systolic function was normal. - Pulmonary arteries: PA systolic pressure 28-32 mmHg. - Systemic veins: IVC measured 2.0 cm with normal respirophasic variation, suggesting RA pressure 6-10 mmHg.  Impressions:  - Normal LV size with mild LV hypertrophy. EF 55%. There did appear to be mild mid to apical anteroseptal hypokinesis. Normal RV size and systolic function. No significant valvular abnormalities.  Assessment and Plan:  1. CAD status post BMS to the LAD in 2011 as well as moderate residual RCA disease. She reports no angina symptoms on medical therapy and we continue conservative management. ECG reviewed.  2. Essential hypertension, blood pressure is adequately controlled today.  Current medicines were reviewed with the patient today.   Orders Placed This Encounter  Procedures  . EKG 12-Lead    Disposition: Follow-up in 6 months.  Signed, Yolanda SidleSamuel G. Tiona Ruane, MD, [facility] 11/01/2016 3:55 PM    [facility] [address], [place], [place] [address] Phone: [phone]([phone]; Fax: [phone](336) [phone]

## 2016-11-01 ENCOUNTER — Ambulatory Visit (INDEPENDENT_AMBULATORY_CARE_PROVIDER_SITE_OTHER): Payer: Medicare Other | Admitting: Cardiology

## 2016-11-01 ENCOUNTER — Encounter: Payer: Self-pay | Admitting: Cardiology

## 2016-11-01 VITALS — BP 123/71 | HR 66 | Ht 61.0 in | Wt 116.6 lb

## 2016-11-01 DIAGNOSIS — I2510 Atherosclerotic heart disease of native coronary artery without angina pectoris: Secondary | ICD-10-CM

## 2016-11-01 DIAGNOSIS — I10 Essential (primary) hypertension: Secondary | ICD-10-CM

## 2016-11-01 NOTE — Patient Instructions (Signed)
Your physician wants you to follow-up in: 6 months with Dr. Mcdowell. You will receive a reminder letter in the mail two months in advance. If you don't receive a letter, please call our office to schedule the follow-up appointment.   Your physician recommends that you continue on your current medications as directed. Please refer to the Current Medication list given to you today.    Thank you for choosing [facility]!!       

## 2016-12-08 ENCOUNTER — Other Ambulatory Visit: Payer: Self-pay | Admitting: Cardiology

## 2016-12-17 DIAGNOSIS — I10 Essential (primary) hypertension: Secondary | ICD-10-CM | POA: Diagnosis not present

## 2016-12-17 DIAGNOSIS — E784 Other hyperlipidemia: Secondary | ICD-10-CM | POA: Diagnosis not present

## 2016-12-17 DIAGNOSIS — Z1389 Encounter for screening for other disorder: Secondary | ICD-10-CM | POA: Diagnosis not present

## 2016-12-17 DIAGNOSIS — M818 Other osteoporosis without current pathological fracture: Secondary | ICD-10-CM | POA: Diagnosis not present

## 2016-12-17 DIAGNOSIS — Z0000 Encounter for general adult medical examination without abnormal findings: Secondary | ICD-10-CM | POA: Diagnosis not present

## 2017-01-04 ENCOUNTER — Other Ambulatory Visit: Payer: Self-pay | Admitting: Cardiology

## 2017-03-19 DIAGNOSIS — I10 Essential (primary) hypertension: Secondary | ICD-10-CM | POA: Diagnosis not present

## 2017-03-19 DIAGNOSIS — M818 Other osteoporosis without current pathological fracture: Secondary | ICD-10-CM | POA: Diagnosis not present

## 2017-03-19 DIAGNOSIS — E784 Other hyperlipidemia: Secondary | ICD-10-CM | POA: Diagnosis not present

## 2017-03-19 DIAGNOSIS — M7551 Bursitis of right shoulder: Secondary | ICD-10-CM | POA: Diagnosis not present

## 2017-05-16 NOTE — Progress Notes (Deleted)
Cardiology Office Note  Date: 05/16/2017   ID: Yolanda Gibson, DOB 03/14/1930, MRN [id number]  PCP: Toma DeitersHasanaj, Xaje A, MD  Primary Cardiologist: Nona DellSamuel Andrzej Scully, MD   No chief complaint on file.   History of Present Illness: Yolanda Gibson is an 83 y.o. female last seen in February.  Past Medical History:  Diagnosis Date  . Coronary atherosclerosis of native coronary artery    BMS LAD 4/11, residual RCA 70-80% mid  . Dementia    Mild  . Essential hypertension, benign   . Hyperlipidemia   . Ischemic cardiomyopathy    LVEF up to 55% 11/13  . Post-infarction apical thrombus (HCC)    Resolved  . STEMI (ST elevation myocardial infarction) (HCC)    Anterior 4/11  . Vertigo     Past Surgical History:  Procedure Laterality Date  . APPENDECTOMY    . CHOLECYSTECTOMY    . HIP SURGERY    . RENAL BIOPSY      Current Outpatient Prescriptions  Medication Sig Dispense Refill  . aspirin 81 MG tablet Take 81 mg by mouth daily.      . calcium-vitamin D (OSCAL WITH D) 500-200 MG-UNIT per tablet Take 1 tablet by mouth daily with breakfast.    . carvedilol (COREG) 6.25 MG tablet TAKE 1 TABLET (6.25 MG TOTAL) BY MOUTH 2 (TWO) TIMES DAILY WITH A MEAL. 60 tablet 6  . clopidogrel (PLAVIX) 75 MG tablet Take 75 mg by mouth daily.    Marland Kitchen. lisinopril (PRINIVIL,ZESTRIL) 5 MG tablet TAKE 1 TABLET (5 MG TOTAL) BY MOUTH DAILY. 90 tablet 1  . meclizine (ANTIVERT) 25 MG tablet Take 25 mg by mouth 3 (three) times daily as needed.    . nitroGLYCERIN (NITROSTAT) 0.4 MG SL tablet Place 1 tablet (0.4 mg total) under the tongue every 5 (five) minutes as needed. Up to 3 doses. If no relief after the 3 rd dose, proceed to the ED for an evaluation. 25 tablet 3  . simvastatin (ZOCOR) 40 MG tablet TAKE 1 TABLET BY MOUTH AT BEDTIME 30 tablet 6  . spironolactone (ALDACTONE) 25 MG tablet Take 0.5 tablets (12.5 mg total) by mouth daily. 15 tablet 6   No current facility-administered medications for this  visit.    Allergies:  Penicillins   Social History: The patient  reports that she has never smoked. She has never used smokeless tobacco. She reports that she does not drink alcohol or use drugs.   Family History: The patient's family history includes Coronary artery disease in her unknown relative.   ROS:  Please see the history of present illness. Otherwise, complete review of systems is positive for {NONE DEFAULTED:18576::"none"}.  All other systems are reviewed and negative.   Physical Exam: VS:  There were no vitals taken for this visit., BMI There is no height or weight on file to calculate BMI.  Wt Readings from Last 3 Encounters:  11/01/16 116 lb 9.6 oz (52.9 kg)  04/26/16 118 lb (53.5 kg)  10/20/15 113 lb (51.3 kg)    Thin elderly woman, appears comfortable at rest.  HEENT: Conjunctiva and lids normal, oropharynx clear.  Neck: Supple, no elevated JVP or carotid bruits, no thyromegaly.  Lungs: Clear to auscultation, nonlabored breathing at rest.  Cardiac: Regular rate and rhythm, no S3, no pericardial rub.  Abdomen: Soft, nontender,bowel sounds present.  Extremities: No pitting edema, distal pulses 2+.   ECG: I personally reviewed the tracing from 11/01/2016 which showed sinus rhythm with low voltage  and poor R wave progression.  Recent Labwork:  December 2016: Cholesterol 125, triglycerides 62, HDL 52, LDL 61, hemoglobin A1c 5.6%  Other Studies Reviewed Today:  Echocardiogram 08/27/2012: Study Conclusions  - Left ventricle: The cavity size was normal. Wall thickness was increased in a pattern of mild LVH. The estimated ejection fraction was 55%. Mild mid to apical anteroseptal hypokinesis. Doppler parameters are consistent with abnormal left ventricular relaxation (grade 1 diastolic dysfunction). - Aortic valve: There was no stenosis. Valve area: 1.98cm^2(VTI). Valve area: 1.96cm^2 (Vmax). - Mitral valve: Trivial regurgitation. - Left atrium: The  atrium was mildly dilated. - Right ventricle: The cavity size was normal. Systolic function was normal. - Pulmonary arteries: PA systolic pressure 28-32 mmHg. - Systemic veins: IVC measured 2.0 cm with normal respirophasic variation, suggesting RA pressure 6-10 mmHg.  Impressions:  - Normal LV size with mild LV hypertrophy. EF 55%. There did appear to be mild mid to apical anteroseptal hypokinesis. Normal RV size and systolic function. No significant valvular abnormalities.  Assessment and Plan:    Current medicines were reviewed with the patient today.  No orders of the defined types were placed in this encounter.   Disposition:  Signed, Jonelle Sidle, MD, [facility] 05/16/2017 7:18 PM    [facility] at [facility] [address] [place], [place], [place] [address] Phone: [phone]; Fax: [phone]

## 2017-05-17 ENCOUNTER — Ambulatory Visit: Payer: Medicare Other | Admitting: Cardiology

## 2017-06-18 DIAGNOSIS — E784 Other hyperlipidemia: Secondary | ICD-10-CM | POA: Diagnosis not present

## 2017-06-18 DIAGNOSIS — M818 Other osteoporosis without current pathological fracture: Secondary | ICD-10-CM | POA: Diagnosis not present

## 2017-06-18 DIAGNOSIS — I10 Essential (primary) hypertension: Secondary | ICD-10-CM | POA: Diagnosis not present

## 2017-06-18 DIAGNOSIS — M7551 Bursitis of right shoulder: Secondary | ICD-10-CM | POA: Diagnosis not present

## 2017-06-19 DIAGNOSIS — E784 Other hyperlipidemia: Secondary | ICD-10-CM | POA: Diagnosis not present

## 2017-06-19 DIAGNOSIS — I10 Essential (primary) hypertension: Secondary | ICD-10-CM | POA: Diagnosis not present

## 2017-07-03 ENCOUNTER — Other Ambulatory Visit: Payer: Self-pay | Admitting: Cardiology

## 2017-07-05 ENCOUNTER — Encounter: Payer: Self-pay | Admitting: Cardiology

## 2017-07-05 ENCOUNTER — Ambulatory Visit (INDEPENDENT_AMBULATORY_CARE_PROVIDER_SITE_OTHER): Payer: Medicare Other | Admitting: Cardiology

## 2017-07-05 VITALS — BP 110/69 | HR 65 | Ht 61.0 in | Wt 120.8 lb

## 2017-07-05 DIAGNOSIS — I2510 Atherosclerotic heart disease of native coronary artery without angina pectoris: Secondary | ICD-10-CM | POA: Diagnosis not present

## 2017-07-05 DIAGNOSIS — E782 Mixed hyperlipidemia: Secondary | ICD-10-CM

## 2017-07-05 NOTE — Progress Notes (Signed)
Cardiology Office Note  Date: 07/05/2017   ID: [name], [name] 06-Jul-1930, MRN [id number]  PCP: Toma Deiters, MD  Primary Cardiologist: [name], MD   Chief Complaint  Patient presents with  . Coronary Artery Disease    History of Present Illness: Yolanda Gibson is an 83 y.o. female last seen in February. She presents with her son for a follow-up visit today. Since last encounter she does not report any angina symptoms or increasing shortness of breath. She continues to use a walker, does not report any falls. She is fairly sedentary at home. She continues to follow with Dr. Olena Leatherwood as an outpatient.  I reviewed her medications which are outlined below. Cardiac regimen includes aspirin, Coreg, Plavix, lisinopril, Zocor, Aldactone, and as needed nitroglycerin.  Past Medical History:  Diagnosis Date  . Coronary atherosclerosis of native coronary artery    BMS LAD 4/11, residual RCA 70-80% mid  . Dementia    Mild  . Essential hypertension, benign   . Hyperlipidemia   . Ischemic cardiomyopathy    LVEF up to 55% 11/13  . Post-infarction apical thrombus (HCC)    Resolved  . STEMI (ST elevation myocardial infarction) (HCC)    Anterior 4/11  . Vertigo     Past Surgical History:  Procedure Laterality Date  . APPENDECTOMY    . CHOLECYSTECTOMY    . HIP SURGERY    . RENAL BIOPSY      Current Outpatient Prescriptions  Medication Sig Dispense Refill  . aspirin 81 MG tablet Take 81 mg by mouth daily.      . calcium-vitamin D (OSCAL WITH D) 500-200 MG-UNIT per tablet Take 1 tablet by mouth daily with breakfast.    . carvedilol (COREG) 6.25 MG tablet TAKE 1 TABLET (6.25 MG TOTAL) BY MOUTH 2 (TWO) TIMES DAILY WITH A MEAL. 60 tablet 6  . clopidogrel (PLAVIX) 75 MG tablet Take 75 mg by mouth daily.    Marland Kitchen lisinopril (PRINIVIL,ZESTRIL) 5 MG tablet TAKE 1 TABLET (5 MG TOTAL) BY MOUTH DAILY. 30 tablet 0  . meclizine (ANTIVERT) 25 MG tablet Take 25 mg by mouth 3  (three) times daily as needed.    . nitroGLYCERIN (NITROSTAT) 0.4 MG SL tablet Place 1 tablet (0.4 mg total) under the tongue every 5 (five) minutes as needed. Up to 3 doses. If no relief after the 3 rd dose, proceed to the ED for an evaluation. 25 tablet 3  . simvastatin (ZOCOR) 40 MG tablet TAKE 1 TABLET BY MOUTH AT BEDTIME 30 tablet 6  . spironolactone (ALDACTONE) 25 MG tablet Take 0.5 tablets (12.5 mg total) by mouth daily. 15 tablet 6   No current facility-administered medications for this visit.    Allergies:  Penicillins   Social History: The patient  reports that she has never smoked. She has never used smokeless tobacco. She reports that she does not drink alcohol or use drugs.   ROS:  Please see the history of present illness. Otherwise, complete review of systems is positive for hearing loss.  All other systems are reviewed and negative.   Physical Exam: VS:  BP 110/69   Pulse 65   Ht  (1.549 m)   Wt 120 lb 12.8 oz (54.8 kg)   BMI 22.82 kg/m , BMI Body mass index is 22.82 kg/m.  Wt Readings from Last 3 Encounters:  07/05/17 120 lb 12.8 oz (54.8 kg)  11/01/16 116 lb 9.6 oz (52.9 kg)  04/26/16 118 lb (  53.5 kg)    General: Elderly woman, appears comfortable at rest. Using a walker. HEENT: Conjunctiva and lids normal, oropharynx clear. Neck: Supple, no elevated JVP or carotid bruits, no thyromegaly. Lungs: Clear to auscultation, nonlabored breathing at rest. Cardiac: Regular rate and rhythm, no S3 or significant systolic murmur, no pericardial rub. Abdomen: Soft, nontender, bowel sounds present, no guarding or rebound. Extremities: No pitting edema, distal pulses 2+. Skin: Warm and dry.  ECG: I personally reviewed the tracing from 11/01/2016 which showed sinus rhythm with poor R-wave progression and low voltage.  Other Studies Reviewed Today:  Echocardiogram 08/27/2012: Study Conclusions  - Left ventricle: The cavity size was normal. Wall thickness was  increased in a pattern of mild LVH. The estimated ejection fraction was 55%. Mild mid to apical anteroseptal hypokinesis. Doppler parameters are consistent with abnormal left ventricular relaxation (grade 1 diastolic dysfunction). - Aortic valve: There was no stenosis. Valve area: 1.98cm^2(VTI). Valve area: 1.96cm^2 (Vmax). - Mitral valve: Trivial regurgitation. - Left atrium: The atrium was mildly dilated. - Right ventricle: The cavity size was normal. Systolic function was normal. - Pulmonary arteries: PA systolic pressure 28-32 mmHg. - Systemic veins: IVC measured 2.0 cm with normal respirophasic variation, suggesting RA pressure 6-10 mmHg. Impressions:  - Normal LV size with mild LV hypertrophy. EF 55%. There did appear to be mild mid to apical anteroseptal hypokinesis. Normal RV size and systolic function. No significant valvular abnormalities.  Assessment and Plan:  1. Symptomatically stable CAD with history of BMS to the LAD in 2011 with moderate residual RCA disease. We are continuing with conservative management in the absence of progressive angina symptoms. No changes made present regimen.  2. Essential hypertension, blood pressure is well controlled today. Keep follow-up with Dr. Olena Leatherwood.  Current medicines were reviewed with the patient today.  Disposition: Follow-up in 6 months.  Signed, Jonelle Sidle, MD, [facility] 07/05/2017 2:12 PM    [facility] at [facility] [address] [place], [place], [place] [address] Phone: [phone]; Fax: [phone]

## 2017-07-05 NOTE — Patient Instructions (Signed)

## 2017-07-10 ENCOUNTER — Other Ambulatory Visit: Payer: Self-pay | Admitting: Cardiology

## 2017-08-04 ENCOUNTER — Other Ambulatory Visit: Payer: Self-pay | Admitting: Cardiology

## 2017-08-09 ENCOUNTER — Other Ambulatory Visit: Payer: Self-pay | Admitting: Cardiology

## 2017-08-13 ENCOUNTER — Other Ambulatory Visit: Payer: Self-pay | Admitting: *Deleted

## 2017-08-13 MED ORDER — LISINOPRIL 5 MG PO TABS: 5.0000 mg | ORAL_TABLET | Freq: Every day | ORAL | 6 refills | Status: DC

## 2017-09-17 DIAGNOSIS — E7849 Other hyperlipidemia: Secondary | ICD-10-CM | POA: Diagnosis not present

## 2017-09-17 DIAGNOSIS — I10 Essential (primary) hypertension: Secondary | ICD-10-CM | POA: Diagnosis not present

## 2017-09-17 DIAGNOSIS — M818 Other osteoporosis without current pathological fracture: Secondary | ICD-10-CM | POA: Diagnosis not present

## 2017-12-09 DIAGNOSIS — E7849 Other hyperlipidemia: Secondary | ICD-10-CM | POA: Diagnosis not present

## 2017-12-09 DIAGNOSIS — M818 Other osteoporosis without current pathological fracture: Secondary | ICD-10-CM | POA: Diagnosis not present

## 2017-12-09 DIAGNOSIS — I10 Essential (primary) hypertension: Secondary | ICD-10-CM | POA: Diagnosis not present

## 2017-12-18 DIAGNOSIS — E7849 Other hyperlipidemia: Secondary | ICD-10-CM | POA: Diagnosis not present

## 2017-12-18 DIAGNOSIS — M818 Other osteoporosis without current pathological fracture: Secondary | ICD-10-CM | POA: Diagnosis not present

## 2017-12-18 DIAGNOSIS — I10 Essential (primary) hypertension: Secondary | ICD-10-CM | POA: Diagnosis not present

## 2018-01-07 NOTE — Progress Notes (Signed)
Cardiology Office Note  Date: 01/10/2018   ID: Margreat, Widener May 28, 1930, MRN [id number]  PCP: Toma Deiters, MD  Primary Cardiologist: [name], MD   Chief Complaint  Patient presents with  . Coronary Artery Disease    History of Present Illness: Yolanda Gibson is an 84 y.o. female last seen in October 2018.  She presents with her son for a follow-up visit.  Reports no angina symptoms with basic ADLs, she remains fairly sedentary.  She uses a wheelchair and walker in her house.  She does not report any falls.  I reviewed her medications.  Cardiac regimen is stable as outlined below.  She does not report any bleeding problems.  I personally reviewed her ECG today which shows sinus rhythm with old anterior infarct pattern.  Past Medical History:  Diagnosis Date  . Coronary atherosclerosis of native coronary artery    BMS LAD 4/11, residual RCA 70-80% mid  . Dementia    Mild  . Essential hypertension, benign   . Hyperlipidemia   . Ischemic cardiomyopathy    LVEF up to 55% 11/13  . Post-infarction apical thrombus (HCC)    Resolved  . STEMI (ST elevation myocardial infarction) (HCC)    Anterior 4/11  . Vertigo     Past Surgical History:  Procedure Laterality Date  . APPENDECTOMY    . CHOLECYSTECTOMY    . HIP SURGERY    . RENAL BIOPSY      Current Outpatient Medications  Medication Sig Dispense Refill  . aspirin 81 MG tablet Take 81 mg by mouth daily.      . calcium-vitamin D (OSCAL WITH D) 500-200 MG-UNIT per tablet Take 1 tablet by mouth daily with breakfast.    . carvedilol (COREG) 6.25 MG tablet TAKE 1 TABLET (6.25 MG TOTAL) BY MOUTH 2 (TWO) TIMES DAILY WITH A MEAL. 60 tablet 6  . clopidogrel (PLAVIX) 75 MG tablet Take 75 mg by mouth daily.    Marland Kitchen lisinopril (PRINIVIL,ZESTRIL) 5 MG tablet Take 1 tablet (5 mg total) daily by mouth. 30 tablet 6  . meclizine (ANTIVERT) 25 MG tablet Take 25 mg by mouth 3 (three) times daily as needed.    .  nitroGLYCERIN (NITROSTAT) 0.4 MG SL tablet Place 1 tablet (0.4 mg total) under the tongue every 5 (five) minutes as needed. Up to 3 doses. If no relief after the 3 rd dose, proceed to the ED for an evaluation. 25 tablet 3  . simvastatin (ZOCOR) 40 MG tablet TAKE 1 TABLET BY MOUTH AT BEDTIME 30 tablet 6  . spironolactone (ALDACTONE) 25 MG tablet Take 0.5 tablets (12.5 mg total) by mouth daily. 15 tablet 6   No current facility-administered medications for this visit.    Allergies:  Penicillins   Social History: The patient  reports that she has never smoked. She has never used smokeless tobacco. She reports that she does not drink alcohol or use drugs.   ROS:  Please see the history of present illness. Otherwise, complete review of systems is positive for arthritic shoulder pain.  All other systems are reviewed and negative.   Physical Exam: VS:  BP 123/75   Pulse 73   Ht 5\' 1"  (1.549 m)   Wt 121 lb 6.4 oz (55.1 kg)   SpO2 95%   BMI 22.94 kg/m , BMI Body mass index is 22.94 kg/m.  Wt Readings from Last 3 Encounters:  01/10/18 121 lb 6.4 oz (55.1 kg)  07/05/17 120 lb  12.8 oz (54.8 kg)  11/01/16 116 lb 9.6 oz (52.9 kg)    General: Elderly woman, appears comfortable at rest.  Rolling walker. HEENT: Conjunctiva and lids normal, oropharynx clear. Neck: Supple, no elevated JVP or carotid bruits, no thyromegaly. Lungs: Clear to auscultation, nonlabored breathing at rest. Cardiac: Regular rate and rhythm, no S3 or significant systolic murmur, no pericardial rub. Abdomen: Soft, nontender, bowel sounds present. Extremities: No pitting edema, distal pulses 2+.  ECG: I personally reviewed the tracing from 11/01/2016 which showed sinus rhythm with poor R-wave progression and low voltage.  Other Studies Reviewed Today:  Echocardiogram 08/27/2012: Study Conclusions  - Left ventricle: The cavity size was normal. Wall thickness was increased in a pattern of mild LVH. The  estimated ejection fraction was 55%. Mild mid to apical anteroseptal hypokinesis. Doppler parameters are consistent with abnormal left ventricular relaxation (grade 1 diastolic dysfunction). - Aortic valve: There was no stenosis. Valve area: 1.98cm^2(VTI). Valve area: 1.96cm^2 (Vmax). - Mitral valve: Trivial regurgitation. - Left atrium: The atrium was mildly dilated. - Right ventricle: The cavity size was normal. Systolic function was normal. - Pulmonary arteries: PA systolic pressure 28-32 mmHg. - Systemic veins: IVC measured 2.0 cm with normal respirophasic variation, suggesting RA pressure 6-10 mmHg.  Impressions:  - Normal LV size with mild LV hypertrophy. EF 55%. There did appear to be mild mid to apical anteroseptal hypokinesis. Normal RV size and systolic function. No significant valvular abnormalities.  Assessment and Plan:  1.  Symptomatically stable CAD status post BMS to the LAD in 2011 with moderate residual RCA disease.  Continue with medical therapy and overall conservative management.  ECG reviewed.  2.  Essential hypertension, blood pressure well controlled today.  Current medicines were reviewed with the patient today.   Orders Placed This Encounter  Procedures  . EKG 12-Lead    Disposition: Follow-up in 6 months.  Signed, Jonelle SidleSamuel G. Rube Sanchez, MD, [facility] 01/10/2018 2:19 PM    [facility] [address], [place], [place] [address] Phone: [phone]) [phone]; Fax: [phone]

## 2018-01-10 ENCOUNTER — Ambulatory Visit (INDEPENDENT_AMBULATORY_CARE_PROVIDER_SITE_OTHER): Payer: Medicare Other | Admitting: Cardiology

## 2018-01-10 ENCOUNTER — Encounter: Payer: Self-pay | Admitting: *Deleted

## 2018-01-10 VITALS — BP 123/75 | HR 73 | Ht 61.0 in | Wt 121.4 lb

## 2018-01-10 DIAGNOSIS — I2510 Atherosclerotic heart disease of native coronary artery without angina pectoris: Secondary | ICD-10-CM | POA: Diagnosis not present

## 2018-01-10 DIAGNOSIS — I10 Essential (primary) hypertension: Secondary | ICD-10-CM | POA: Diagnosis not present

## 2018-01-10 NOTE — Patient Instructions (Signed)

## 2018-03-01 ENCOUNTER — Other Ambulatory Visit: Payer: Self-pay | Admitting: Cardiology

## 2018-03-20 DIAGNOSIS — E7849 Other hyperlipidemia: Secondary | ICD-10-CM | POA: Diagnosis not present

## 2018-03-20 DIAGNOSIS — M818 Other osteoporosis without current pathological fracture: Secondary | ICD-10-CM | POA: Diagnosis not present

## 2018-03-20 DIAGNOSIS — I10 Essential (primary) hypertension: Secondary | ICD-10-CM | POA: Diagnosis not present

## 2018-04-29 ENCOUNTER — Other Ambulatory Visit: Payer: Self-pay | Admitting: Cardiology

## 2018-06-04 DIAGNOSIS — M818 Other osteoporosis without current pathological fracture: Secondary | ICD-10-CM | POA: Diagnosis not present

## 2018-06-04 DIAGNOSIS — E7849 Other hyperlipidemia: Secondary | ICD-10-CM | POA: Diagnosis not present

## 2018-06-04 DIAGNOSIS — I10 Essential (primary) hypertension: Secondary | ICD-10-CM | POA: Diagnosis not present

## 2018-06-23 DIAGNOSIS — L570 Actinic keratosis: Secondary | ICD-10-CM | POA: Diagnosis not present

## 2018-06-23 DIAGNOSIS — M818 Other osteoporosis without current pathological fracture: Secondary | ICD-10-CM | POA: Diagnosis not present

## 2018-06-23 DIAGNOSIS — I10 Essential (primary) hypertension: Secondary | ICD-10-CM | POA: Diagnosis not present

## 2018-06-23 DIAGNOSIS — E7849 Other hyperlipidemia: Secondary | ICD-10-CM | POA: Diagnosis not present

## 2018-07-11 NOTE — Progress Notes (Signed)
Cardiology Office Note  Date: 07/14/2018   ID: Estalene, Bergey [birth year], MRN [id number]  PCP: Toma Deiters, MD  Primary Cardiologist: [name], MD   Chief Complaint  Patient presents with  . Coronary Artery Disease    History of Present Illness: Khristi Schiller is an 84 y.o. female last seen in April.  She is here today with her son for a follow-up visit.  She does not report any major change in health since her last visit.  No obvious angina symptoms, still using a walker to ambulate and reports no falls.  She does not get outdoors very much.  I reviewed her medications.  Cardiac regimen includes aspirin, Plavix, Coreg, lisinopril, Zocor, Aldactone, and as needed nitroglycerin.  She continues to follow with Dr. Olena Leatherwood.  Past Medical History:  Diagnosis Date  . Coronary atherosclerosis of native coronary artery    BMS LAD 4/11, residual RCA 70-80% mid  . Dementia (HCC)    Mild  . Essential hypertension, benign   . Hyperlipidemia   . Ischemic cardiomyopathy    LVEF up to 55% 11/13  . Post-infarction apical thrombus (HCC)    Resolved  . STEMI (ST elevation myocardial infarction) (HCC)    Anterior 4/11  . Vertigo     Past Surgical History:  Procedure Laterality Date  . APPENDECTOMY    . CHOLECYSTECTOMY    . HIP SURGERY    . RENAL BIOPSY      Current Outpatient Medications  Medication Sig Dispense Refill  . aspirin 81 MG tablet Take 81 mg by mouth daily.      . calcium-vitamin D (OSCAL WITH D) 500-200 MG-UNIT per tablet Take 1 tablet by mouth daily with breakfast.    . carvedilol (COREG) 6.25 MG tablet TAKE 1 TABLET (6.25 MG TOTAL) BY MOUTH 2 (TWO) TIMES DAILY WITH A MEAL. 60 tablet 6  . clopidogrel (PLAVIX) 75 MG tablet Take 75 mg by mouth daily.    Marland Kitchen lisinopril (PRINIVIL,ZESTRIL) 5 MG tablet TAKE 1 TABLET (5 MG TOTAL) DAILY BY MOUTH. 90 tablet 2  . meclizine (ANTIVERT) 25 MG tablet Take 25 mg by mouth 3 (three) times daily as needed.     . nitroGLYCERIN (NITROSTAT) 0.4 MG SL tablet Place 1 tablet (0.4 mg total) under the tongue every 5 (five) minutes as needed. Up to 3 doses. If no relief after the 3 rd dose, proceed to the ED for an evaluation. 25 tablet 3  . simvastatin (ZOCOR) 40 MG tablet TAKE 1 TABLET BY MOUTH AT BEDTIME 30 tablet 6  . spironolactone (ALDACTONE) 25 MG tablet Take 0.5 tablets (12.5 mg total) by mouth daily. 15 tablet 6   No current facility-administered medications for this visit.    Allergies:  Penicillins   Social History: The patient  reports that she has never smoked. She has never used smokeless tobacco. She reports that she does not drink alcohol or use drugs.   ROS:  Please see the history of present illness. Otherwise, complete review of systems is positive for hearing loss.  All other systems are reviewed and negative.   Physical Exam: VS:  BP 132/64   Pulse 65   Ht 5\' 1"  (1.549 m)   Wt 123 lb (55.8 kg)   SpO2 97%   BMI 23.24 kg/m , BMI Body mass index is 23.24 kg/m.  Wt Readings from Last 3 Encounters:  07/14/18 123 lb (55.8 kg)  01/10/18 121 lb 6.4 oz (55.1 kg)  07/05/17 120 lb 12.8 oz (54.8 kg)    General: Elderly woman, appears comfortable at rest. HEENT: Conjunctiva and lids normal, oropharynx clear. Neck: Supple, no elevated JVP or carotid bruits, no thyromegaly. Lungs: Clear to auscultation, nonlabored breathing at rest. Cardiac: Regular rate and rhythm, no S3 or significant systolic murmur. Abdomen: Soft, nontender, bowel sounds present. Extremities: No pitting edema, distal pulses 2+.  ECG: I personally reviewed the tracing from 01/10/2018 which showed sinus rhythm with old anterior infarct.  Recent Labwork:  December 2016: Cholesterol 125, triglycerides 62, HDL 52, LDL 61, hemoglobin A1c 5.6%  Other Studies Reviewed Today:  Echocardiogram 08/27/2012: Study Conclusions  - Left ventricle: The cavity size was normal. Wall thickness was increased in a pattern of  mild LVH. The estimated ejection fraction was 55%. Mild mid to apical anteroseptal hypokinesis. Doppler parameters are consistent with abnormal left ventricular relaxation (grade 1 diastolic dysfunction). - Aortic valve: There was no stenosis. Valve area: 1.98cm^2(VTI). Valve area: 1.96cm^2 (Vmax). - Mitral valve: Trivial regurgitation. - Left atrium: The atrium was mildly dilated. - Right ventricle: The cavity size was normal. Systolic function was normal. - Pulmonary arteries: PA systolic pressure 28-32 mmHg. - Systemic veins: IVC measured 2.0 cm with normal respirophasic variation, suggesting RA pressure 6-10 mmHg.  Impressions:  - Normal LV size with mild LV hypertrophy. EF 55%. There did appear to be mild mid to apical anteroseptal hypokinesis. Normal RV size and systolic function. No significant valvular abnormalities.  Assessment and Plan:  1.  CAD with history of BMS to the LAD in 2011 and moderate residual RCA disease.  We will continue with conservative follow-up, she is stable on medical therapy without active angina symptoms.  2.  Essential hypertension, blood pressure is adequately controlled today.  Keep follow-up with Dr. Olena Leatherwood.  Current medicines were reviewed with the patient today.  Disposition: Follow-up in 6 months.  Signed, Jonelle Sidle, MD, [facility] 07/14/2018 12:04 PM    [facility] at [facility] [address] [place], [place], [place] [address] Phone: [phone]; Fax: [phone]

## 2018-07-14 ENCOUNTER — Encounter: Payer: Self-pay | Admitting: Cardiology

## 2018-07-14 ENCOUNTER — Ambulatory Visit (INDEPENDENT_AMBULATORY_CARE_PROVIDER_SITE_OTHER): Payer: Medicare Other | Admitting: Cardiology

## 2018-07-14 VITALS — BP 132/64 | HR 65 | Ht 61.0 in | Wt 123.0 lb

## 2018-07-14 DIAGNOSIS — I10 Essential (primary) hypertension: Secondary | ICD-10-CM | POA: Diagnosis not present

## 2018-07-14 DIAGNOSIS — I25119 Atherosclerotic heart disease of native coronary artery with unspecified angina pectoris: Secondary | ICD-10-CM

## 2018-07-14 DIAGNOSIS — Z23 Encounter for immunization: Secondary | ICD-10-CM | POA: Diagnosis not present

## 2018-07-14 DIAGNOSIS — I2510 Atherosclerotic heart disease of native coronary artery without angina pectoris: Secondary | ICD-10-CM | POA: Diagnosis not present

## 2018-07-14 NOTE — Patient Instructions (Signed)

## 2018-08-22 ENCOUNTER — Other Ambulatory Visit: Payer: Self-pay | Admitting: Cardiology

## 2018-08-25 DIAGNOSIS — M818 Other osteoporosis without current pathological fracture: Secondary | ICD-10-CM | POA: Diagnosis not present

## 2018-08-25 DIAGNOSIS — E7849 Other hyperlipidemia: Secondary | ICD-10-CM | POA: Diagnosis not present

## 2018-08-25 DIAGNOSIS — I10 Essential (primary) hypertension: Secondary | ICD-10-CM | POA: Diagnosis not present

## 2018-09-22 DIAGNOSIS — M818 Other osteoporosis without current pathological fracture: Secondary | ICD-10-CM | POA: Diagnosis not present

## 2018-09-22 DIAGNOSIS — Z0000 Encounter for general adult medical examination without abnormal findings: Secondary | ICD-10-CM | POA: Diagnosis not present

## 2018-09-22 DIAGNOSIS — Z1389 Encounter for screening for other disorder: Secondary | ICD-10-CM | POA: Diagnosis not present

## 2018-09-22 DIAGNOSIS — E7849 Other hyperlipidemia: Secondary | ICD-10-CM | POA: Diagnosis not present

## 2018-09-22 DIAGNOSIS — I10 Essential (primary) hypertension: Secondary | ICD-10-CM | POA: Diagnosis not present

## 2018-10-20 DIAGNOSIS — E7849 Other hyperlipidemia: Secondary | ICD-10-CM | POA: Diagnosis not present

## 2018-10-20 DIAGNOSIS — I10 Essential (primary) hypertension: Secondary | ICD-10-CM | POA: Diagnosis not present

## 2018-10-28 DIAGNOSIS — Z961 Presence of intraocular lens: Secondary | ICD-10-CM | POA: Diagnosis not present

## 2018-11-04 DIAGNOSIS — M810 Age-related osteoporosis without current pathological fracture: Secondary | ICD-10-CM | POA: Diagnosis not present

## 2018-11-17 DIAGNOSIS — I10 Essential (primary) hypertension: Secondary | ICD-10-CM | POA: Diagnosis not present

## 2018-11-17 DIAGNOSIS — E7849 Other hyperlipidemia: Secondary | ICD-10-CM | POA: Diagnosis not present

## 2018-12-15 DIAGNOSIS — I10 Essential (primary) hypertension: Secondary | ICD-10-CM | POA: Diagnosis not present

## 2018-12-15 DIAGNOSIS — E7849 Other hyperlipidemia: Secondary | ICD-10-CM | POA: Diagnosis not present

## 2019-01-16 ENCOUNTER — Other Ambulatory Visit: Payer: Self-pay | Admitting: Cardiology

## 2019-01-16 ENCOUNTER — Ambulatory Visit: Payer: Medicare Other | Admitting: Cardiology

## 2019-01-22 DIAGNOSIS — E7849 Other hyperlipidemia: Secondary | ICD-10-CM | POA: Diagnosis not present

## 2019-01-22 DIAGNOSIS — I10 Essential (primary) hypertension: Secondary | ICD-10-CM | POA: Diagnosis not present

## 2019-02-20 DIAGNOSIS — E7849 Other hyperlipidemia: Secondary | ICD-10-CM | POA: Diagnosis not present

## 2019-02-20 DIAGNOSIS — I10 Essential (primary) hypertension: Secondary | ICD-10-CM | POA: Diagnosis not present

## 2019-03-12 DIAGNOSIS — M818 Other osteoporosis without current pathological fracture: Secondary | ICD-10-CM | POA: Diagnosis not present

## 2019-03-12 DIAGNOSIS — Z0000 Encounter for general adult medical examination without abnormal findings: Secondary | ICD-10-CM | POA: Diagnosis not present

## 2019-03-12 DIAGNOSIS — E7849 Other hyperlipidemia: Secondary | ICD-10-CM | POA: Diagnosis not present

## 2019-03-12 DIAGNOSIS — I10 Essential (primary) hypertension: Secondary | ICD-10-CM | POA: Diagnosis not present

## 2019-03-25 ENCOUNTER — Encounter: Payer: Self-pay | Admitting: *Deleted

## 2019-03-26 ENCOUNTER — Ambulatory Visit (INDEPENDENT_AMBULATORY_CARE_PROVIDER_SITE_OTHER): Payer: Medicare Other | Admitting: Physician Assistant

## 2019-03-26 ENCOUNTER — Other Ambulatory Visit: Payer: Self-pay

## 2019-03-26 ENCOUNTER — Encounter: Payer: Self-pay | Admitting: Physician Assistant

## 2019-03-26 VITALS — BP 127/72 | HR 68 | Temp 98.1°F | Ht 61.0 in | Wt 126.6 lb

## 2019-03-26 DIAGNOSIS — I2510 Atherosclerotic heart disease of native coronary artery without angina pectoris: Secondary | ICD-10-CM | POA: Diagnosis not present

## 2019-03-26 DIAGNOSIS — E782 Mixed hyperlipidemia: Secondary | ICD-10-CM | POA: Diagnosis not present

## 2019-03-26 DIAGNOSIS — I10 Essential (primary) hypertension: Secondary | ICD-10-CM

## 2019-03-26 NOTE — Progress Notes (Signed)
Cardiology Office Note   Date:  03/26/2019   ID:  Yolanda Gibson, Yolanda Gibson [birth year], MRN [id number]  PCP:  Neale Burly, MD Cardiologist:  Rozann Lesches, MD 07/24/2018 Rosaria Ferries, PA-C    History of Present Illness: Yolanda Gibson is a 85 y.o. 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  Her family does the cooking, they use low salt. Her family is with her all the time. They provide transportation.   Left leg swells more than the R, chronic. Both lower legs are tender. This is also chronic. Occasional leg cramps, she takes mustard with relief.   Denies orthopnea or PND.   She uses a walker, so does not walk much. Tries to move around and not sit all the time.  Her weight at Dr Joselyn Arrow office was 59 a few weeks ago. He has done blood work and she was told it was ok.   She never gets chest pain.  She gets a little short of breath when she exerts herself, but her exertion level is low.  Feels she does well with her meds, sets them out every day to take.    Past Medical History:  Diagnosis Date  . Coronary atherosclerosis of native coronary artery    BMS LAD 4/11, residual RCA 70-80% mid  . Dementia ([place])    Mild  . Essential hypertension, benign   . Hyperlipidemia   . Ischemic cardiomyopathy    LVEF up to 55% 11/13  . Post-infarction apical thrombus (HCC)    Resolved  . STEMI (ST elevation myocardial infarction) ([place])    Anterior 4/11  . Vertigo     Past Surgical History:  Procedure Laterality Date  . APPENDECTOMY    . CHOLECYSTECTOMY    . HIP SURGERY    . RENAL BIOPSY      Current Outpatient Medications  Medication Sig Dispense Refill  . aspirin 81 MG tablet Take 81 mg by mouth daily.      . calcium-vitamin D (OSCAL WITH D)  500-200 MG-UNIT per tablet Take 1 tablet by mouth daily with breakfast.    . carvedilol (COREG) 6.25 MG tablet TAKE 1 TABLET (6.25 MG TOTAL) BY MOUTH 2 (TWO) TIMES DAILY WITH A MEAL. 180 tablet 2  . clopidogrel (PLAVIX) 75 MG tablet Take 75 mg by mouth daily.    Marland Kitchen lisinopril (ZESTRIL) 5 MG tablet TAKE 1 TABLET (5 MG TOTAL) DAILY BY MOUTH. 90 tablet 2  . meclizine (ANTIVERT) 25 MG tablet Take 25 mg by mouth 3 (three) times daily as needed.    . nitroGLYCERIN (NITROSTAT) 0.4 MG SL tablet Place 1 tablet (0.4 mg total) under the tongue every 5 (five) minutes as needed. Up to 3 doses. If no relief after the 3 rd dose, proceed to the ED for an evaluation. 25 tablet 3  . simvastatin (ZOCOR) 40 MG tablet TAKE 1 TABLET BY MOUTH AT BEDTIME 30 tablet 6  . spironolactone (ALDACTONE) 25 MG tablet Take 0.5 tablets (12.5 mg total) by mouth daily. 15 tablet 6   No current facility-administered medications for this visit.     Allergies:   Penicillins    Social History:  The patient  reports that she has never smoked. She has never used smokeless  tobacco. She reports that she does not drink alcohol or use drugs.   Family History:  The patient's family history includes Coronary artery disease in an other family member.  She indicated that the status of her other is unknown.   ROS:  Please see the history of present illness. All other systems are reviewed and negative.    PHYSICAL EXAM: VS:  BP 127/72   Pulse 68   Temp 98.1 F (36.7 C)   Ht 5\' 1"  (1.549 m)   Wt 126 lb 9.6 oz (57.4 kg)   SpO2 99%   BMI 23.92 kg/m  , BMI Body mass index is 23.92 kg/m. GEN: Well nourished, well developed, female in no acute distress HEENT: normal for age  Neck: no JVD, no carotid bruit, no masses Cardiac: RRR; no murmur, no rubs, or gallops Respiratory:  clear to auscultation bilaterally, normal work of breathing GI: soft, nontender, nondistended, + BS MS: no deformity or atrophy; trace LE edema; distal pulses are  2+ in all 4 extremities  Skin: warm and dry, no rash Neuro:  Strength and sensation are intact Psych: euthymic mood, full affect   EKG:  EKG is ordered today. The ekg ordered today demonstrates sinus rhythm poor R wave progression not significantly changed from 2019, no acute ischemic changes  ECHO:  - Left ventricle: The cavity size was normal. Wall thickness  was increased in a pattern of mild LVH. The estimated  ejection fraction was 55%. Mild mid to apical anteroseptal  hypokinesis. Doppler parameters are consistent with  abnormal left ventricular relaxation (grade 1 diastolic  dysfunction).  - Aortic valve: There was no stenosis. Valve area:  1.98cm^2(VTI). Valve area: 1.96cm^2 (Vmax).  - Mitral valve: Trivial regurgitation.  - Left atrium: The atrium was mildly dilated.  - Right ventricle: The cavity size was normal. Systolic  function was normal.  - Pulmonary arteries: PA systolic pressure 28-32 mmHg.  - Systemic veins: IVC measured 2.0 cm with normal  respirophasic variation, suggesting RA pressure 6-10 mmHg.  Impressions:   - Normal LV size with mild LV hypertrophy. EF 55%. There did  appear to be mild mid to apical anteroseptal hypokinesis.  Normal RV size and systolic function. No significant  valvular abnormalities.   CATH: 12/2009 Separate ostia of the LAD and circumflex LAD 99%>>BMS Minor disease circumflex RCA 70% EF 25%  Recent Labs: No results found for requested labs within last 8760 hours.  CBC    Component Value Date/Time   WBC 7.5 02/01/2010 1146   RBC 3.55 (L) 02/01/2010 1146   HGB 12.1 02/01/2010 1146   HCT 34.8 (L) 02/01/2010 1146   PLT 262 02/01/2010 1146   MCV 98.0 02/01/2010 1146   MCHC 34.8 02/01/2010 1146   RDW 13.9 02/01/2010 1146   LYMPHSABS 1.2 01/25/2010 0645   MONOABS 0.9 01/25/2010 0645   EOSABS 0.5 01/25/2010 0645   BASOSABS 0.0 01/25/2010 0645   CMP Latest Ref Rng & Units 02/05/2010 02/02/2010 02/01/2010   Glucose 70 - 99 mg/dL 91 161(W102(H) 93  BUN 6 - 23 mg/dL 14 16 96(E24(H)  Creatinine 0.4 - 1.2 mg/dL 4.540.72 0.980.76 1.190.94  Sodium 135 - 145 mEq/L 135 130(L) 133(L)  Potassium 3.5 - 5.1 mEq/L 4.2 4.3 4.4  Chloride 96 - 112 mEq/L 102 98 99  CO2 19 - 32 mEq/L 29 27 27   Calcium 8.4 - 10.5 mg/dL 9.3 9.2 9.3  Total Protein 6.0 - 8.3 g/dL - - -  Total Bilirubin 0.3 - 1.2  mg/dL - - -  Alkaline Phos 39 - 117 U/L - - -  AST 0 - 37 U/L - - -  ALT 0 - 35 U/L - - -     Lipid Panel Lab Results  Component Value Date   CHOL  125  03/20/2018        HDL  46      LDLCALC  not listed          TRIG  58         Wt Readings from Last 3 Encounters:  03/26/19 126 lb 9.6 oz (57.4 kg)  07/14/18 123 lb (55.8 kg)  01/10/18 121 lb 6.4 oz (55.1 kg)     Other studies Reviewed: Additional studies/ records that were reviewed today include: Office notes, hospital records and testing.  ASSESSMENT AND PLAN:  1.  CAD: - She is not having any ischemic symptoms, no testing -Continue aspirin, Plavix, beta-blocker, lisinopril, statin, spironolactone and as needed nitro  2.  Hypertension: -Blood pressure is well controlled on current medications  3.  Hyperlipidemia: - She works at being compliant with her medications - PCP follows her labs, we need current ones, but she had good control in 2019   Current medicines are reviewed at length with the patient today.  The patient does not have concerns regarding medicines.  The following changes have been made:  no change  Labs/ tests ordered today include:   Orders Placed This Encounter  Procedures  . EKG 12-Lead     Disposition:   FU with Nona DellSamuel McDowell, MD in 6 months  Signed, Theodore DemarkRhonda Barrett, PA-C  03/26/2019 3:34 PM    [facility] Phone: [phone]; Fax: [phone]

## 2019-03-26 NOTE — Patient Instructions (Addendum)
Medication Instructions:   Your physician recommends that you continue on your current medications as directed. Please refer to the Current Medication list given to you today.  Labwork:  Please have your lab work sent to our office from Dr. Rhett Bannister office.  Testing/Procedures:  NONE  Follow-Up:  Your physician recommends that you schedule a follow-up appointment in: 6 months. You will receive a reminder letter in the mail in about 4 months reminding you to call and schedule your appointment. If you don't receive this letter, please contact our office.  Any Other Special Instructions Will Be Listed Below (If Applicable). Your physician recommends that you weigh, weekly, at the same time of the day, and in the same amount of clothing. Please record your weekly weights. Call our office if you have problems with shortness of breath or swelling or weight gain of 3 lbs in one day or 5 lbs in one week.   If you need a refill on your cardiac medications before your next appointment, please call your pharmacy.

## 2019-04-15 DIAGNOSIS — I10 Essential (primary) hypertension: Secondary | ICD-10-CM | POA: Diagnosis not present

## 2019-04-15 DIAGNOSIS — M818 Other osteoporosis without current pathological fracture: Secondary | ICD-10-CM | POA: Diagnosis not present

## 2019-04-15 DIAGNOSIS — E7849 Other hyperlipidemia: Secondary | ICD-10-CM | POA: Diagnosis not present

## 2019-05-01 ENCOUNTER — Other Ambulatory Visit: Payer: Self-pay

## 2019-05-21 ENCOUNTER — Other Ambulatory Visit: Payer: Self-pay | Admitting: Cardiology

## 2019-05-28 DIAGNOSIS — M818 Other osteoporosis without current pathological fracture: Secondary | ICD-10-CM | POA: Diagnosis not present

## 2019-05-28 DIAGNOSIS — E7849 Other hyperlipidemia: Secondary | ICD-10-CM | POA: Diagnosis not present

## 2019-05-28 DIAGNOSIS — I10 Essential (primary) hypertension: Secondary | ICD-10-CM | POA: Diagnosis not present

## 2019-06-03 DIAGNOSIS — E7849 Other hyperlipidemia: Secondary | ICD-10-CM | POA: Diagnosis not present

## 2019-06-03 DIAGNOSIS — M818 Other osteoporosis without current pathological fracture: Secondary | ICD-10-CM | POA: Diagnosis not present

## 2019-06-03 DIAGNOSIS — I10 Essential (primary) hypertension: Secondary | ICD-10-CM | POA: Diagnosis not present

## 2019-06-12 DIAGNOSIS — I10 Essential (primary) hypertension: Secondary | ICD-10-CM | POA: Diagnosis not present

## 2019-06-12 DIAGNOSIS — E7849 Other hyperlipidemia: Secondary | ICD-10-CM | POA: Diagnosis not present

## 2019-06-12 DIAGNOSIS — M818 Other osteoporosis without current pathological fracture: Secondary | ICD-10-CM | POA: Diagnosis not present

## 2019-09-10 ENCOUNTER — Encounter: Payer: Self-pay | Admitting: Family Medicine

## 2019-09-10 DIAGNOSIS — E7849 Other hyperlipidemia: Secondary | ICD-10-CM | POA: Diagnosis not present

## 2019-09-10 DIAGNOSIS — M818 Other osteoporosis without current pathological fracture: Secondary | ICD-10-CM | POA: Diagnosis not present

## 2019-09-10 DIAGNOSIS — B070 Plantar wart: Secondary | ICD-10-CM | POA: Diagnosis not present

## 2019-09-10 DIAGNOSIS — I10 Essential (primary) hypertension: Secondary | ICD-10-CM | POA: Diagnosis not present

## 2019-10-21 ENCOUNTER — Other Ambulatory Visit: Payer: Self-pay | Admitting: Cardiology

## 2019-12-09 DIAGNOSIS — Z0000 Encounter for general adult medical examination without abnormal findings: Secondary | ICD-10-CM | POA: Diagnosis not present

## 2019-12-09 DIAGNOSIS — E7849 Other hyperlipidemia: Secondary | ICD-10-CM | POA: Diagnosis not present

## 2019-12-09 DIAGNOSIS — I10 Essential (primary) hypertension: Secondary | ICD-10-CM | POA: Diagnosis not present

## 2019-12-09 DIAGNOSIS — Z1331 Encounter for screening for depression: Secondary | ICD-10-CM | POA: Diagnosis not present

## 2019-12-09 DIAGNOSIS — M818 Other osteoporosis without current pathological fracture: Secondary | ICD-10-CM | POA: Diagnosis not present

## 2019-12-29 NOTE — Progress Notes (Signed)
Cardiology Office Note  Date: 12/30/2019   ID: Yolanda Gibson, Yolanda Gibson 1929-10-11, MRN [id number]  PCP:  Neale Burly, MD  Cardiologist:  Rozann Lesches, MD Electrophysiologist:  None   Chief Complaint: Follow-up for CAD, HTN, HLD, ICM, CVA  History of Present Illness: Yolanda Gibson is a 86 y.o. 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  Her last visit was with Rosaria Ferries, PA in March 26, 2019.  She was having some issues with chronic lower extremity edema with left leg greater than right.  She was using a walker to ambulate but was not walking much.  She was getting a little short of breath  during exertion but exertion level was low.  She was compliant with all of her medications.  Patient denies any significant changes since last visit.  She denies any progressive anginal or exertional symptoms, orthostatic symptoms, stroke or TIA-like symptoms, palpitations or arrhythmias, bleeding issues, claudication-like symptoms, DVT or PE-like symptoms.  Has some mild lower extremity edema.  States she saw Dr. Sherrie Sport in December of last year and had lab work.  She is using a walker to ambulate with today.  Past Medical History:  Diagnosis Date  . Coronary atherosclerosis of native coronary artery    BMS LAD 4/11, residual RCA 70-80% mid  . Dementia ([place])    Mild  . Essential hypertension, benign   . Hyperlipidemia   . Ischemic cardiomyopathy    LVEF up to 55% 11/13  . Post-infarction apical thrombus (HCC)    Resolved  . STEMI (ST elevation myocardial infarction) ([place])    Anterior 4/11  . Vertigo     Past Surgical History:  Procedure Laterality Date  . APPENDECTOMY    . CHOLECYSTECTOMY    . HIP SURGERY    . RENAL BIOPSY        Current Outpatient Medications  Medication Sig Dispense Refill  . aspirin 81 MG tablet Take 81 mg by mouth daily.      . calcium-vitamin D (OSCAL WITH D) 500-200 MG-UNIT per tablet Take 1 tablet by mouth daily with breakfast.    . carvedilol (COREG) 6.25 MG tablet TAKE 1 TABLET (6.25 MG TOTAL) BY MOUTH 2 (TWO) TIMES DAILY WITH A MEAL. 180 tablet 2  . clopidogrel (PLAVIX) 75 MG tablet Take 75 mg by mouth daily.    Marland Kitchen lisinopril (ZESTRIL) 5 MG tablet TAKE 1 TABLET (5 MG TOTAL) DAILY BY MOUTH. 90 tablet 2  . meclizine (ANTIVERT) 25 MG tablet Take 25 mg by mouth 3 (three) times daily as needed.    . nitroGLYCERIN (NITROSTAT) 0.4 MG SL tablet Place 1 tablet (0.4 mg total) under the tongue every 5 (five) minutes as needed. Up to 3 doses. If no relief after the 3 rd dose, proceed to the ED for an evaluation. 25 tablet 3  . simvastatin (ZOCOR) 40 MG tablet TAKE 1 TABLET BY MOUTH AT BEDTIME 30 tablet 6  . spironolactone (ALDACTONE) 25 MG tablet Take 0.5 tablets (12.5 mg total) by mouth daily. 15 tablet 6   No current facility-administered medications for this visit.   Allergies:  Penicillins   Social History: The patient  reports that she has never smoked. She has  never used smokeless tobacco. She reports that she does not drink alcohol or use drugs.   Family History: The patient's family history includes Coronary artery disease in an other family member.   ROS:  Please see the history of present illness. Otherwise, complete review of systems is positive for none.  All other systems are reviewed and negative.   Physical Exam: VS:  BP 116/64   Pulse 72   Ht 5\' 1"  (1.549 m)   Wt 128 lb 3.2 oz (58.2 kg)   SpO2 97%   BMI 24.22 kg/m , BMI Body mass index is 24.22 kg/m.  Wt Readings from Last 3 Encounters:  12/30/19 128 lb 3.2 oz (58.2 kg)  03/26/19 126 lb 9.6 oz (57.4 kg)  07/14/18 123 lb (55.8 kg)    General: Patient appears comfortable at rest. Neck: Supple, no elevated JVP or carotid  bruits, no thyromegaly. Lungs: Clear to auscultation, nonlabored breathing at rest. Cardiac: Regular rate and rhythm, no S3 or significant systolic murmur, no pericardial rub. Extremities: No pitting edema, distal pulses 2+. Skin: Warm and dry. Musculoskeletal: No kyphosis. Neuropsychiatric: Alert and oriented x3, affect grossly appropriate.  ECG: None today  Recent Labwork:  Labs from PCP office on 03/13/2019 showed; total cholesterol 144, triglycerides 111, HDL 42, LDL 80.  Creatinine 0.83, GFR 74,   Other Studies Reviewed Today:  ECHO: 08/27/2012 - Left ventricle: The cavity size was normal. Wall thickness  was increased in a pattern of mild LVH. The estimated  ejection fraction was 55%. Mild mid to apical anteroseptal  hypokinesis. Doppler parameters are consistent with  abnormal left ventricular relaxation (grade 1 diastolic  dysfunction).  - Aortic valve: There was no stenosis. Valve area:  1.98cm^2(VTI). Valve area: 1.96cm^2 (Vmax).  - Mitral valve: Trivial regurgitation.  - Left atrium: The atrium was mildly dilated.  - Right ventricle: The cavity size was normal. Systolic  function was normal.  - Pulmonary arteries: PA systolic pressure 28-32 mmHg.  - Systemic veins: IVC measured 2.0 cm with normal  respirophasic variation, suggesting RA pressure 6-10 mmHg.  Impressions:   - Normal LV size with mild LV hypertrophy. EF 55%. There did  appear to be mild mid to apical anteroseptal hypokinesis.  Normal RV size and systolic function. No significant  valvular abnormalities.   CATH: 12/2009 Separate ostia of the LAD and circumflex LAD 99%>>BMS Minor disease circumflex RCA 70% EF 25%  Assessment and Plan:  1. CAD in native artery   2. Essential hypertension   3. Mixed hyperlipidemia     1. CAD in native artery Patient denies any progressive anginal or exertional symptoms.  Continue aspirin 81 mg, Plavix 75 mg, daily, nitroglycerin  sublingual as needed.  2. Essential hypertension Blood pressures well controlled on current medications at 116/64 today.  Continue lisinopril 5 mg, spironolactone 12.5 mg daily.  3. Mixed hyperlipidemia Lipid panel at PCP office 05/12/2019; total cholesterol 144, triglycerides 111, HDL 42, LDL 80.  Continue simvastatin 40 mg p.o. daily.  Patient states she had lab work at her PCP office in December of last year.  We will attempt to obtain those results.   Medication Adjustments/Labs and Tests Ordered: Current medicines are reviewed at length with the patient today.  Concerns regarding medicines are outlined above.   Disposition: Follow-up with Dr. January or APP 6 months  Signed, Diona Browner, NP 12/30/2019 10:39 AM    [facility] at [facility] [address] [place], [place], [place] Phone: [phone])  [phone]; Fax: [phone]

## 2019-12-30 ENCOUNTER — Other Ambulatory Visit: Payer: Self-pay

## 2019-12-30 ENCOUNTER — Encounter: Payer: Self-pay | Admitting: Family Medicine

## 2019-12-30 ENCOUNTER — Ambulatory Visit (INDEPENDENT_AMBULATORY_CARE_PROVIDER_SITE_OTHER): Payer: Medicare Other | Admitting: Family Medicine

## 2019-12-30 ENCOUNTER — Encounter: Payer: Self-pay | Admitting: *Deleted

## 2019-12-30 VITALS — BP 116/64 | HR 72 | Ht 61.0 in | Wt 128.2 lb

## 2019-12-30 DIAGNOSIS — I2510 Atherosclerotic heart disease of native coronary artery without angina pectoris: Secondary | ICD-10-CM

## 2019-12-30 DIAGNOSIS — E782 Mixed hyperlipidemia: Secondary | ICD-10-CM | POA: Diagnosis not present

## 2019-12-30 DIAGNOSIS — I10 Essential (primary) hypertension: Secondary | ICD-10-CM | POA: Diagnosis not present

## 2019-12-30 NOTE — Patient Instructions (Signed)

## 2020-01-09 DIAGNOSIS — Z23 Encounter for immunization: Secondary | ICD-10-CM | POA: Diagnosis not present

## 2020-01-13 DIAGNOSIS — I10 Essential (primary) hypertension: Secondary | ICD-10-CM | POA: Diagnosis not present

## 2020-01-13 DIAGNOSIS — M818 Other osteoporosis without current pathological fracture: Secondary | ICD-10-CM | POA: Diagnosis not present

## 2020-01-13 DIAGNOSIS — E7849 Other hyperlipidemia: Secondary | ICD-10-CM | POA: Diagnosis not present

## 2020-01-30 DIAGNOSIS — Z23 Encounter for immunization: Secondary | ICD-10-CM | POA: Diagnosis not present

## 2020-02-27 ENCOUNTER — Other Ambulatory Visit: Payer: Self-pay | Admitting: Cardiology

## 2020-03-09 DIAGNOSIS — E7849 Other hyperlipidemia: Secondary | ICD-10-CM | POA: Diagnosis not present

## 2020-03-09 DIAGNOSIS — H6122 Impacted cerumen, left ear: Secondary | ICD-10-CM | POA: Diagnosis not present

## 2020-03-09 DIAGNOSIS — I10 Essential (primary) hypertension: Secondary | ICD-10-CM | POA: Diagnosis not present

## 2020-03-09 DIAGNOSIS — M818 Other osteoporosis without current pathological fracture: Secondary | ICD-10-CM | POA: Diagnosis not present

## 2020-03-17 DIAGNOSIS — I10 Essential (primary) hypertension: Secondary | ICD-10-CM | POA: Diagnosis not present

## 2020-03-17 DIAGNOSIS — M818 Other osteoporosis without current pathological fracture: Secondary | ICD-10-CM | POA: Diagnosis not present

## 2020-03-17 DIAGNOSIS — E7849 Other hyperlipidemia: Secondary | ICD-10-CM | POA: Diagnosis not present

## 2020-05-17 DIAGNOSIS — E7849 Other hyperlipidemia: Secondary | ICD-10-CM | POA: Diagnosis not present

## 2020-05-17 DIAGNOSIS — M818 Other osteoporosis without current pathological fracture: Secondary | ICD-10-CM | POA: Diagnosis not present

## 2020-05-17 DIAGNOSIS — I10 Essential (primary) hypertension: Secondary | ICD-10-CM | POA: Diagnosis not present

## 2020-06-13 DIAGNOSIS — E7849 Other hyperlipidemia: Secondary | ICD-10-CM | POA: Diagnosis not present

## 2020-06-13 DIAGNOSIS — M818 Other osteoporosis without current pathological fracture: Secondary | ICD-10-CM | POA: Diagnosis not present

## 2020-06-13 DIAGNOSIS — I10 Essential (primary) hypertension: Secondary | ICD-10-CM | POA: Diagnosis not present

## 2020-07-20 NOTE — Progress Notes (Signed)
Cardiology Office Note  Date: 07/21/2020   ID: Chelsea, Pedretti 1930/06/12, MRN [id number]  PCP:  Toma Deiters, MD  Cardiologist:  [name], MD Electrophysiologist:  None   Chief Complaint: Follow-up for CAD, HTN, HLD, ICM, CVA  History of Present Illness: Yolanda Gibson is a 86 y.o. 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  Her last visit was with Theodore Demark, PA in March 26, 2019.  She was having some issues with chronic lower extremity edema with left leg greater than right.  She was using a walker to ambulate but was not walking much.  She was getting a little short of breath  during exertion but exertion level was low.  She was compliant with all of her medications.  She is here today for 65-month follow-up.  She denies any recent acute illnesses or hospitalizations.  Denies any anginal or exertional symptoms, palpitations or arrhythmias, orthostatic symptoms, PND, orthopnea, bleeding symptoms, CVA or TIA-like symptoms, DVT or PE-like symptoms, claudication-like symptoms, or lower extremity edema.  She is using a walker to ambulate today.  States she has some occasional balance issues due to inner ear problems but no significant lightheadedness, dizziness, presyncopal or syncopal episodes.  Past Medical History:  Diagnosis Date  . Coronary atherosclerosis of native coronary artery    BMS LAD 4/11, residual RCA 70-80% mid  . Dementia (HCC)    Mild  . Essential hypertension, benign   . Hyperlipidemia   . Ischemic cardiomyopathy    LVEF up to 55% 11/13  . Post-infarction apical thrombus (HCC)    Resolved  . STEMI (ST elevation myocardial infarction) (HCC)    Anterior 4/11  . Vertigo     Past Surgical  History:  Procedure Laterality Date  . APPENDECTOMY    . CHOLECYSTECTOMY    . HIP SURGERY    . RENAL BIOPSY      Current Outpatient Medications  Medication Sig Dispense Refill  . aspirin 81 MG tablet Take 81 mg by mouth daily.      . calcium-vitamin D (OSCAL WITH D) 500-200 MG-UNIT per tablet Take 1 tablet by mouth daily with breakfast.    . carvedilol (COREG) 6.25 MG tablet TAKE 1 TABLET (6.25 MG TOTAL) BY MOUTH 2 (TWO) TIMES DAILY WITH A MEAL. 180 tablet 1  . clopidogrel (PLAVIX) 75 MG tablet Take 75 mg by mouth daily.    Marland Kitchen lisinopril (ZESTRIL) 5 MG tablet TAKE 1 TABLET BY MOUTH EVERY DAY 90 tablet 2  . meclizine (ANTIVERT) 25 MG tablet Take 25 mg by mouth 3 (three) times daily as needed.    . nitroGLYCERIN (NITROSTAT) 0.4 MG SL tablet Place 1 tablet (0.4 mg total) under the tongue every 5 (five) minutes as needed. Up to 3 doses. If no relief after the 3 rd dose, proceed to the ED for an evaluation. 25 tablet 3  . simvastatin (ZOCOR) 40 MG tablet TAKE 1 TABLET BY MOUTH AT BEDTIME 30 tablet 6  . spironolactone (ALDACTONE) 25 MG tablet Take 0.5 tablets (12.5 mg total) by mouth daily. 15 tablet 6   No current facility-administered medications for this visit.   Allergies:  Penicillins   Social History: The patient  reports that she has never smoked. She has never used smokeless tobacco. She reports that she does not drink alcohol and does not use drugs.   Family History: The patient's family history includes Coronary artery disease in an other family member.   ROS:  Please see the history of present illness. Otherwise, complete review of systems is positive for none.  All other systems are reviewed and negative.   Physical Exam: VS:  BP 122/60   Pulse 69   Ht 5\' 1"  (1.549 m)   Wt 128 lb 9.6 oz (58.3 kg)   SpO2 96%   BMI 24.30 kg/m , BMI Body mass index is 24.3 kg/m.  Wt Readings from Last 3 Encounters:  07/21/20 128 lb 9.6 oz (58.3 kg)  12/30/19 128 lb 3.2 oz (58.2 kg)   03/26/19 126 lb 9.6 oz (57.4 kg)    General: Patient appears comfortable at rest. Neck: Supple, no elevated JVP or carotid bruits, no thyromegaly. Lungs: Clear to auscultation, nonlabored breathing at rest. Cardiac: Regular rate and rhythm, no S3 or significant systolic murmur, no pericardial rub. Extremities: No pitting edema, distal pulses 2+. Skin: Warm and dry. Musculoskeletal: No kyphosis. Neuropsychiatric: Alert and oriented x3, affect grossly appropriate.  ECG: EKG today normal sinus rhythm rate of 69.  Nonspecific ST and T wave abnormality  Recent Labwork:  Labs from PCP office on 03/13/2019 showed; total cholesterol 144, triglycerides 111, HDL 42, LDL 80.  Creatinine 0.83, GFR 74,   Other Studies Reviewed Today:  ECHO: 08/27/2012 - Left ventricle: The cavity size was normal. Wall thickness  was increased in a pattern of mild LVH. The estimated  ejection fraction was 55%. Mild mid to apical anteroseptal  hypokinesis. Doppler parameters are consistent with  abnormal left ventricular relaxation (grade 1 diastolic  dysfunction).  - Aortic valve: There was no stenosis. Valve area:  1.98cm^2(VTI). Valve area: 1.96cm^2 (Vmax).  - Mitral valve: Trivial regurgitation.  - Left atrium: The atrium was mildly dilated.  - Right ventricle: The cavity size was normal. Systolic  function was normal.  - Pulmonary arteries: PA systolic pressure 28-32 mmHg.  - Systemic veins: IVC measured 2.0 cm with normal  respirophasic variation, suggesting RA pressure 6-10 mmHg.  Impressions:   - Normal LV size with mild LV hypertrophy. EF 55%. There did  appear to be mild mid to apical anteroseptal hypokinesis.  Normal RV size and systolic function. No significant  valvular abnormalities.   CATH: 12/2009 Separate ostia of the LAD and circumflex LAD 99%>>BMS Minor disease circumflex RCA 70% EF 25%  Assessment and Plan:  1. CAD in native artery Patient denies any  progressive anginal or exertional symptoms.  Continue aspirin 81 mg, Plavix 75 mg, daily, nitroglycerin sublingual as needed.  2. Essential hypertension Blood pressures well controlled on current medications at 122/60.  Continue lisinopril 5 mg, spironolactone 12.5 mg daily.  3. Mixed hyperlipidemia Lipid panel at PCP office 05/12/2019; total cholesterol 144, triglycerides 111, HDL 42, LDL 80.  Continue simvastatin 40 mg p.o. daily.  Patient states she had lab work at PCP office recently and everything was normal.  We will attempt to obtain from PCP.  Medication Adjustments/Labs and Tests Ordered: Current medicines are reviewed at length with the patient today.  Concerns regarding medicines are outlined above.   Disposition: Follow-up with Dr. 07/12/2019 or APP 6 months  Signed, Diona Browner, NP 07/21/2020 11:54 AM    [facility] at [facility] [address] [place], [place], [place]  [place] Phone: [phone]; Fax: [phone]

## 2020-07-21 ENCOUNTER — Ambulatory Visit (INDEPENDENT_AMBULATORY_CARE_PROVIDER_SITE_OTHER): Payer: Medicare Other | Admitting: Family Medicine

## 2020-07-21 ENCOUNTER — Encounter: Payer: Self-pay | Admitting: Family Medicine

## 2020-07-21 ENCOUNTER — Other Ambulatory Visit: Payer: Self-pay | Admitting: Cardiology

## 2020-07-21 ENCOUNTER — Other Ambulatory Visit: Payer: Self-pay

## 2020-07-21 VITALS — BP 122/60 | HR 69 | Ht 61.0 in | Wt 128.6 lb

## 2020-07-21 DIAGNOSIS — I2510 Atherosclerotic heart disease of native coronary artery without angina pectoris: Secondary | ICD-10-CM | POA: Diagnosis not present

## 2020-07-21 DIAGNOSIS — I10 Essential (primary) hypertension: Secondary | ICD-10-CM

## 2020-07-21 DIAGNOSIS — E782 Mixed hyperlipidemia: Secondary | ICD-10-CM | POA: Diagnosis not present

## 2020-07-21 NOTE — Patient Instructions (Signed)
Medication Instructions:  Continue all current medications.   Labwork: none  Testing/Procedures: none  Follow-Up: 6 months   Any Other Special Instructions Will Be Listed Below (If Applicable).   If you need a refill on your cardiac medications before your next appointment, please call your pharmacy.  

## 2020-08-26 ENCOUNTER — Other Ambulatory Visit: Payer: Self-pay | Admitting: Cardiology

## 2020-08-29 DIAGNOSIS — I10 Essential (primary) hypertension: Secondary | ICD-10-CM | POA: Diagnosis not present

## 2020-08-29 DIAGNOSIS — E7849 Other hyperlipidemia: Secondary | ICD-10-CM | POA: Diagnosis not present

## 2020-08-29 DIAGNOSIS — M818 Other osteoporosis without current pathological fracture: Secondary | ICD-10-CM | POA: Diagnosis not present

## 2020-09-12 DIAGNOSIS — M818 Other osteoporosis without current pathological fracture: Secondary | ICD-10-CM | POA: Diagnosis not present

## 2020-09-12 DIAGNOSIS — I10 Essential (primary) hypertension: Secondary | ICD-10-CM | POA: Diagnosis not present

## 2020-09-12 DIAGNOSIS — E7849 Other hyperlipidemia: Secondary | ICD-10-CM | POA: Diagnosis not present

## 2020-09-22 DIAGNOSIS — E7849 Other hyperlipidemia: Secondary | ICD-10-CM | POA: Diagnosis not present

## 2020-09-22 DIAGNOSIS — I10 Essential (primary) hypertension: Secondary | ICD-10-CM | POA: Diagnosis not present

## 2020-09-22 DIAGNOSIS — M818 Other osteoporosis without current pathological fracture: Secondary | ICD-10-CM | POA: Diagnosis not present

## 2020-12-13 DIAGNOSIS — Z1331 Encounter for screening for depression: Secondary | ICD-10-CM | POA: Diagnosis not present

## 2020-12-13 DIAGNOSIS — Z0000 Encounter for general adult medical examination without abnormal findings: Secondary | ICD-10-CM | POA: Diagnosis not present

## 2020-12-13 DIAGNOSIS — M818 Other osteoporosis without current pathological fracture: Secondary | ICD-10-CM | POA: Diagnosis not present

## 2020-12-13 DIAGNOSIS — E7849 Other hyperlipidemia: Secondary | ICD-10-CM | POA: Diagnosis not present

## 2020-12-13 DIAGNOSIS — I10 Essential (primary) hypertension: Secondary | ICD-10-CM | POA: Diagnosis not present

## 2021-01-18 NOTE — Progress Notes (Signed)
Cardiology Office Note  Date: 01/19/2021   ID: Keni, Wafer 26-Aug-1930, MRN [id number]  PCP:  Toma Deiters, MD  Cardiologist:  [name], MD Electrophysiologist:  None   Chief Complaint: 84-month follow-up  History of Present Illness: Yolanda Gibson is a 87 y.o. 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  Her last visit was with Theodore Demark, PA in March 26, 2019.  She was having some issues with chronic lower extremity edema with left leg greater than right.  She was using a walker to ambulate but was not walking much.  She was getting a little short of breath  during exertion but exertion level was low.  She was compliant with all of her medications.  She was last here for 84-month follow-up.  Denied any recent acute illnesses or hospitalizations.  Denied any anginal or exertional symptoms, palpitations or arrhythmias, orthostatic symptoms, PND, orthopnea, bleeding symptoms, CVA or TIA-like symptoms, DVT or PE-like symptoms, claudication-like symptoms, or lower extremity edema.  She was using a walker to ambulate.  She was having some occasional balance issues due to inner ear problems but no significant lightheadedness, dizziness, presyncopal or syncopal episodes.  She is here for a 11-month follow-up today.  She denies any recent issues.  States she saw her primary care provider not long ago and had some lab work.  Her son who is with her states they have not received any notification as to lab work.  He states she was placed on fish oil due to her cholesterol being up a little.  She denies any anginal or exertional symptoms, palpitations or arrhythmias, orthostatic symptoms, CVA or TIA-like symptoms.  Denies any shortness of  breath or dyspnea on exertion.  She has gained around 4 pounds since last visit.  Denies any lower extremity edema.  Past Medical History:  Diagnosis Date  . Coronary atherosclerosis of native coronary artery    BMS LAD 4/11, residual RCA 70-80% mid  . Dementia (HCC)    Mild  . Essential hypertension, benign   . Hyperlipidemia   . Ischemic cardiomyopathy    LVEF up to 55% 11/13  . Post-infarction apical thrombus (HCC)    Resolved  . STEMI (ST elevation myocardial infarction) (HCC)    Anterior 4/11  . Vertigo     Past Surgical History:  Procedure Laterality Date  . APPENDECTOMY    . CHOLECYSTECTOMY    . HIP SURGERY    . RENAL BIOPSY      Current Outpatient Medications  Medication Sig Dispense Refill  . aspirin 81 MG tablet Take 81 mg by mouth daily.    . calcium-vitamin D (OSCAL WITH D) 500-200 MG-UNIT per tablet Take 1 tablet by mouth daily with breakfast.    . carvedilol (COREG) 6.25 MG tablet TAKE 1 TABLET (6.25 MG TOTAL) BY MOUTH 2 (TWO) TIMES DAILY WITH A MEAL. 180 tablet 1  . clopidogrel (PLAVIX) 75 MG tablet Take 75 mg by mouth daily.    Marland Kitchen lisinopril (ZESTRIL) 5 MG tablet TAKE 1 TABLET BY MOUTH EVERY DAY 90 tablet 2  . meclizine (ANTIVERT) 25 MG tablet Take 25 mg by mouth 3 (three) times daily as needed.    Marland Kitchen  nitroGLYCERIN (NITROSTAT) 0.4 MG SL tablet Place 1 tablet (0.4 mg total) under the tongue every 5 (five) minutes as needed. Up to 3 doses. If no relief after the 3 rd dose, proceed to the ED for an evaluation. 25 tablet 3  . simvastatin (ZOCOR) 40 MG tablet TAKE 1 TABLET BY MOUTH AT BEDTIME 30 tablet 6  . spironolactone (ALDACTONE) 25 MG tablet Take 0.5 tablets (12.5 mg total) by mouth daily. 15 tablet 6   No current facility-administered medications for this visit.   Allergies:  Penicillins   Social History: The patient  reports that she has never smoked. She has never used smokeless tobacco. She reports that she does not drink alcohol and does not use drugs.    Family History: The patient's family history includes Coronary artery disease in an other family member.   ROS:  Please see the history of present illness. Otherwise, complete review of systems is positive for none.  All other systems are reviewed and negative.   Physical Exam: VS:  BP 118/70   Pulse 78   Ht 5\' 3"  (1.6 m)   Wt 132 lb 6.4 oz (60.1 kg)   SpO2 98%   BMI 23.45 kg/m , BMI Body mass index is 23.45 kg/m.  Wt Readings from Last 3 Encounters:  01/19/21 132 lb 6.4 oz (60.1 kg)  07/21/20 128 lb 9.6 oz (58.3 kg)  12/30/19 128 lb 3.2 oz (58.2 kg)    General: Patient appears comfortable at rest. Neck: Supple, no elevated JVP or carotid bruits, no thyromegaly. Lungs: Inspiratory and expiratory wheezing right greater than left, nonlabored breathing at rest. Cardiac: Regular rate and rhythm, no S3 or significant systolic murmur, no pericardial rub. Extremities: No pitting edema, distal pulses 2+. Skin: Warm and dry. Musculoskeletal: No kyphosis. Neuropsychiatric: Alert and oriented x3, affect grossly appropriate.  ECG: EKG today normal sinus rhythm rate of 69.  Nonspecific ST and T wave abnormality  Recent Labwork:  Labs from PCP office on 03/13/2019 showed; total cholesterol 144, triglycerides 111, HDL 42, LDL 80.  Creatinine 0.83, GFR 74,   Other Studies Reviewed Today:  ECHO: 08/27/2012 - Left ventricle: The cavity size was normal. Wall thickness  was increased in a pattern of mild LVH. The estimated  ejection fraction was 55%. Mild mid to apical anteroseptal  hypokinesis. Doppler parameters are consistent with  abnormal left ventricular relaxation (grade 1 diastolic  dysfunction).  - Aortic valve: There was no stenosis. Valve area:  1.98cm^2(VTI). Valve area: 1.96cm^2 (Vmax).  - Mitral valve: Trivial regurgitation.  - Left atrium: The atrium was mildly dilated.  - Right ventricle: The cavity size was normal. Systolic  function was normal.  -  Pulmonary arteries: PA systolic pressure 28-32 mmHg.  - Systemic veins: IVC measured 2.0 cm with normal  respirophasic variation, suggesting RA pressure 6-10 mmHg.  Impressions:   - Normal LV size with mild LV hypertrophy. EF 55%. There did  appear to be mild mid to apical anteroseptal hypokinesis.  Normal RV size and systolic function. No significant  valvular abnormalities.   CATH: 12/2009 Separate ostia of the LAD and circumflex LAD 99%>>BMS Minor disease circumflex RCA 70% EF 25%  Assessment and Plan:  1. CAD in native artery Patient denies any progressive anginal or exertional symptoms.  Continue aspirin 81 mg, Plavix 75 mg, daily, nitroglycerin sublingual as needed.  2. Essential hypertension Blood pressures well controlled on current medications at 118/70 continue lisinopril 5 mg, spironolactone 12.5 mg daily.  3. Mixed  hyperlipidemia Lipid panel at PCP office 05/12/2019; total cholesterol 144, triglycerides 111, HDL 42, LDL 80.  Continue simvastatin 40 mg p.o. daily.  Patient states she had lab work at PCP office recently and was placed on fish oil due to slightly elevated lipids.  4.  Wheezing She has some inspiratory and expiratory wheezing but denies any shortness of breath or DOE.  Please get a PA and lateral chest x-ray to assess.  Her son states she does have some asthma.  Medication Adjustments/Labs and Tests Ordered: Current medicines are reviewed at length with the patient today.  Concerns regarding medicines are outlined above.   Disposition: Follow-up with Dr. Diona Browner or APP 6 months  Signed, Rennis Harding, NP 01/19/2021 1:43 PM    [facility] at [facility] [address] [place], [place], [place] [address] Phone: [phone]; Fax: [phone]

## 2021-01-19 ENCOUNTER — Ambulatory Visit (INDEPENDENT_AMBULATORY_CARE_PROVIDER_SITE_OTHER): Payer: Medicare Other | Admitting: Family Medicine

## 2021-01-19 ENCOUNTER — Encounter: Payer: Self-pay | Admitting: Family Medicine

## 2021-01-19 ENCOUNTER — Ambulatory Visit (HOSPITAL_COMMUNITY)
Admission: RE | Admit: 2021-01-19 | Discharge: 2021-01-19 | Disposition: A | Discharge status: Home / Self Care | Payer: Medicare Other | Source: Ambulatory Visit | Attending: Family Medicine | Admitting: Family Medicine

## 2021-01-19 ENCOUNTER — Other Ambulatory Visit: Payer: Self-pay

## 2021-01-19 VITALS — BP 118/70 | HR 78 | Ht 63.0 in | Wt 132.4 lb

## 2021-01-19 DIAGNOSIS — R062 Wheezing: Secondary | ICD-10-CM | POA: Insufficient documentation

## 2021-01-19 DIAGNOSIS — J90 Pleural effusion, not elsewhere classified: Secondary | ICD-10-CM | POA: Diagnosis not present

## 2021-01-19 DIAGNOSIS — J9811 Atelectasis: Secondary | ICD-10-CM | POA: Diagnosis not present

## 2021-01-19 NOTE — Patient Instructions (Signed)
Medication Instructions:  °Continue all current medications. ° °Labwork: °none ° °Testing/Procedures: °A chest x-ray takes a picture of the organs and structures inside the chest, including the heart, lungs, and blood vessels. This test can show several things, including, whether the heart is enlarges; whether fluid is building up in the lungs; and whether pacemaker / defibrillator leads are still in place.  °Office will contact with results via phone or letter.    ° °Follow-Up: °6 months  ° °Any Other Special Instructions Will Be Listed Below (If Applicable). ° ° °If you need a refill on your cardiac medications before your next appointment, please call your pharmacy. ° °

## 2021-01-20 ENCOUNTER — Telehealth: Payer: Self-pay | Admitting: *Deleted

## 2021-01-20 NOTE — Telephone Encounter (Signed)
Lesle Chris, LPN  9/92/4268 3:41 PM EDT Back to Top     Son Kathlene November) notified. Copy to pcp.

## 2021-01-20 NOTE — Telephone Encounter (Signed)
-----   Message from Netta Neat., NP sent at 01/20/2021  4:19 PM EDT ----- Please call the patient or (her son preferably) and let him know she had some small bilateral pleural effusions.  These are not significant.  It showed she has some chronic hyperinflation of her lungs suggestive of emphysema.  I do not think she has ever smoked.  She may have been exposed to secondhand smoke in the past.  Thanks

## 2021-02-22 ENCOUNTER — Other Ambulatory Visit: Payer: Self-pay | Admitting: Cardiology

## 2021-03-16 DIAGNOSIS — J449 Chronic obstructive pulmonary disease, unspecified: Secondary | ICD-10-CM | POA: Diagnosis not present

## 2021-03-16 DIAGNOSIS — I700 Atherosclerosis of aorta: Secondary | ICD-10-CM | POA: Diagnosis not present

## 2021-03-16 DIAGNOSIS — M818 Other osteoporosis without current pathological fracture: Secondary | ICD-10-CM | POA: Diagnosis not present

## 2021-03-16 DIAGNOSIS — E7849 Other hyperlipidemia: Secondary | ICD-10-CM | POA: Diagnosis not present

## 2021-03-16 DIAGNOSIS — I10 Essential (primary) hypertension: Secondary | ICD-10-CM | POA: Diagnosis not present

## 2021-04-07 ENCOUNTER — Other Ambulatory Visit: Payer: Self-pay | Admitting: Cardiology

## 2021-06-29 ENCOUNTER — Encounter: Payer: Self-pay | Admitting: Cardiology

## 2021-06-29 DIAGNOSIS — E7849 Other hyperlipidemia: Secondary | ICD-10-CM | POA: Diagnosis not present

## 2021-06-29 DIAGNOSIS — I700 Atherosclerosis of aorta: Secondary | ICD-10-CM | POA: Diagnosis not present

## 2021-06-29 DIAGNOSIS — M818 Other osteoporosis without current pathological fracture: Secondary | ICD-10-CM | POA: Diagnosis not present

## 2021-06-29 DIAGNOSIS — I10 Essential (primary) hypertension: Secondary | ICD-10-CM | POA: Diagnosis not present

## 2021-06-29 DIAGNOSIS — J449 Chronic obstructive pulmonary disease, unspecified: Secondary | ICD-10-CM | POA: Diagnosis not present

## 2021-07-19 NOTE — Progress Notes (Signed)
Cardiology Office Note  Date: 07/20/2021   ID: Gibson, Yolanda 03/16/1930, MRN [id number]  PCP:  Yolanda Deiters, MD  Cardiologist:  [name], MD Electrophysiologist:  None   Chief Complaint: 33-month follow-up  History of Present Illness: Yolanda Gibson is a 87 y.o. 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  She is here today for 67-month follow-up with her son.  He states she has been doing well without any issues.  Son states that she had recent lab work with PCP and everything was normal.  Her blood pressure is well controlled at 120/70 today.  She denies any recent anginal or exertional symptoms.  No palpitations or arrhythmias.  No lightheadedness, dizziness, presyncope or syncope.  No PND or orthopnea no CVA or TIA-like symptoms, no bleeding.  No claudication-like symptoms, DVT or PE-like symptoms, or lower extremity edema.  EKG today normal sinus rhythm rate of 69.  There have been no medication changes in the interim since last visit.  Past Medical History:  Diagnosis Date   Coronary atherosclerosis of native coronary artery    BMS LAD 4/11, residual RCA 70-80% mid   Dementia (HCC)    Mild   Essential hypertension, benign    Hyperlipidemia    Ischemic cardiomyopathy    LVEF up to 55% 11/13   Post-infarction apical thrombus (HCC)    Resolved   STEMI (ST elevation myocardial infarction) [facility])    Anterior 4/11   Vertigo     Past Surgical History:  Procedure Laterality Date   APPENDECTOMY     CHOLECYSTECTOMY     HIP SURGERY     RENAL BIOPSY      Current Outpatient Medications  Medication Sig Dispense Refill   aspirin 81 MG tablet Take 81 mg by mouth daily.     calcium-vitamin D (OSCAL WITH D) 500-200  MG-UNIT per tablet Take 1 tablet by mouth daily with breakfast.     clopidogrel (PLAVIX) 75 MG tablet Take 75 mg by mouth daily.     meclizine (ANTIVERT) 25 MG tablet Take 25 mg by mouth 3 (three) times daily as needed.     nitroGLYCERIN (NITROSTAT) 0.4 MG SL tablet Place 1 tablet (0.4 mg total) under the tongue every 5 (five) minutes as needed. Up to 3 doses. If no relief after the 3 rd dose, proceed to the ED for an evaluation. 25 tablet 3   simvastatin (ZOCOR) 40 MG tablet TAKE 1 TABLET BY MOUTH AT BEDTIME 30 tablet 6   spironolactone (ALDACTONE) 25 MG tablet Take 0.5 tablets (12.5 mg total) by mouth daily. 15 tablet 6   carvedilol (COREG) 6.25 MG tablet Take 1 tablet (6.25 mg total) by mouth 2 (two) times daily with a meal. 180 tablet 2   lisinopril (ZESTRIL) 5 MG tablet Take 1 tablet (5 mg total) by mouth daily. 90 tablet 2   No current facility-administered medications for this visit.   Allergies:  Penicillins   Social History: The patient  reports that she has never smoked. She has never used smokeless tobacco. She reports that she does not drink alcohol and does not use drugs.   Family History: The patient's family history includes Coronary artery disease in  an other family member.   ROS:  Please see the history of present illness. Otherwise, complete review of systems is positive for none.  All other systems are reviewed and negative.   Physical Exam: VS:  BP 120/70   Pulse 68   Ht 5\' 3"  (1.6 m)   Wt 134 lb 6.4 oz (61 kg)   SpO2 98%   BMI 23.81 kg/m , BMI Body mass index is 23.81 kg/m.  Wt Readings from Last 3 Encounters:  07/20/21 134 lb 6.4 oz (61 kg)  01/19/21 132 lb 6.4 oz (60.1 kg)  07/21/20 128 lb 9.6 oz (58.3 kg)    General: Patient appears comfortable at rest. Neck: Supple, no elevated JVP or carotid bruits, no thyromegaly. Lungs: Inspiratory and expiratory wheezing right greater than left, nonlabored breathing at rest. Cardiac: Regular rate and rhythm, no S3 or  significant systolic murmur, no pericardial rub. Extremities: No pitting edema, distal pulses 2+. Skin: Warm and dry. Musculoskeletal: No kyphosis. Neuropsychiatric: Alert and oriented x3, affect grossly appropriate.  ECG: EKG today normal sinus rhythm rate of 69.  Nonspecific ST and T wave abnormality  Recent Labwork:  Labs from PCP office on 03/13/2019 showed; total cholesterol 144, triglycerides 111, HDL 42, LDL 80.  Creatinine 0.83, GFR 74,   Other Studies Reviewed Today:  Chest x-ray 2 view 01/20/2019 IMPRESSION: 1. Small bilateral pleural effusions. 2. Overlying linear bibasilar opacities, favor atelectasis. 3. Chronic hyperinflation, suggestive of emphysema.    ECHO: 08/27/2012 - Left ventricle: The cavity size was normal. Wall thickness    was increased in a pattern of mild LVH. The estimated    ejection fraction was 55%. Mild mid to apical anteroseptal    hypokinesis. Doppler parameters are consistent with    abnormal left ventricular relaxation (grade 1 diastolic    dysfunction).  - Aortic valve: There was no stenosis. Valve area:    1.98cm^2(VTI). Valve area: 1.96cm^2 (Vmax).  - Mitral valve: Trivial regurgitation.  - Left atrium: The atrium was mildly dilated.  - Right ventricle: The cavity size was normal. Systolic    function was normal.  - Pulmonary arteries: PA systolic pressure 28-32 mmHg.  - Systemic veins: IVC measured 2.0 cm with normal    respirophasic variation, suggesting RA pressure 6-10 mmHg.  Impressions:   - Normal LV size with mild LV hypertrophy. EF 55%. There did    appear to be mild mid to apical anteroseptal hypokinesis.    Normal RV size and systolic function. No significant    valvular abnormalities.    CATH: 12/2009 Separate ostia of the LAD and circumflex LAD 99%>>BMS Minor disease circumflex RCA 70% EF 25%  Assessment and Plan:  1. CAD in native artery Patient denies any progressive anginal or exertional symptoms.  Continue  aspirin 81 mg, Plavix 75 mg, daily, nitroglycerin sublingual as needed.  2. Essential hypertension Blood pressure today 120/70.  Continue lisinopril 5 mg, spironolactone 12.5 mg daily.  3. Mixed hyperlipidemia Continue simvastatin 40 mg p.o. daily.  Patient states she had lab work at PCP office recently and was placed on fish oil due to slightly elevated lipids.  4.  Wheezing At last visit she had inspiratory and expiratory wheezing but denied any shortness of breath or DOE.  Chest x-ray showed she had some small bilateral pleural effusion.  Some overlying linear bibasilar opacities favoring atelectasis.  Chronic hyperinflation suggestive of emphysema.  Today she denies any shortness of breath.  No wheezing heard on exam.  Medication Adjustments/Labs  and Tests Ordered: Current medicines are reviewed at length with the patient today.  Concerns regarding medicines are outlined above.   Disposition: Follow-up with Dr. Diona Browner or APP 6 months  Signed, Rennis Harding, NP 07/20/2021 2:29 PM    [facility] at [facility] [address] [place], [place], [place] [address] Phone: [phone]; Fax: [phone]

## 2021-07-20 ENCOUNTER — Encounter: Payer: Self-pay | Admitting: Family Medicine

## 2021-07-20 ENCOUNTER — Ambulatory Visit (INDEPENDENT_AMBULATORY_CARE_PROVIDER_SITE_OTHER): Payer: Medicare Other | Admitting: Family Medicine

## 2021-07-20 VITALS — BP 120/70 | HR 68 | Ht 63.0 in | Wt 134.4 lb

## 2021-07-20 DIAGNOSIS — I2510 Atherosclerotic heart disease of native coronary artery without angina pectoris: Secondary | ICD-10-CM

## 2021-07-20 DIAGNOSIS — R062 Wheezing: Secondary | ICD-10-CM

## 2021-07-20 DIAGNOSIS — E782 Mixed hyperlipidemia: Secondary | ICD-10-CM | POA: Diagnosis not present

## 2021-07-20 DIAGNOSIS — I10 Essential (primary) hypertension: Secondary | ICD-10-CM

## 2021-07-20 MED ORDER — CARVEDILOL 6.25 MG PO TABS: 6.2500 mg | ORAL_TABLET | Freq: Two times a day (BID) | ORAL | 2 refills | Status: DC

## 2021-07-20 MED ORDER — LISINOPRIL 5 MG PO TABS: 5.0000 mg | ORAL_TABLET | Freq: Every day | ORAL | 2 refills | Status: DC

## 2021-07-20 NOTE — Patient Instructions (Signed)
Medication Instructions:  Your physician recommends that you continue on your current medications as directed. Please refer to the Current Medication list given to you today.  Labwork: none  Testing/Procedures: none  Follow-Up: Your physician recommends that you schedule a follow-up appointment in: 6 months with Dr. McDowell  Any Other Special Instructions Will Be Listed Below (If Applicable).  If you need a refill on your cardiac medications before your next appointment, please call your pharmacy. 

## 2021-08-19 ENCOUNTER — Other Ambulatory Visit: Payer: Self-pay | Admitting: Cardiology

## 2021-09-28 DIAGNOSIS — I10 Essential (primary) hypertension: Secondary | ICD-10-CM | POA: Diagnosis not present

## 2021-09-28 DIAGNOSIS — J449 Chronic obstructive pulmonary disease, unspecified: Secondary | ICD-10-CM | POA: Diagnosis not present

## 2021-09-28 DIAGNOSIS — I700 Atherosclerosis of aorta: Secondary | ICD-10-CM | POA: Diagnosis not present

## 2021-09-28 DIAGNOSIS — E7849 Other hyperlipidemia: Secondary | ICD-10-CM | POA: Diagnosis not present

## 2021-09-28 DIAGNOSIS — M818 Other osteoporosis without current pathological fracture: Secondary | ICD-10-CM | POA: Diagnosis not present

## 2021-12-03 ENCOUNTER — Other Ambulatory Visit: Payer: Self-pay | Admitting: Cardiology

## 2021-12-27 DIAGNOSIS — I700 Atherosclerosis of aorta: Secondary | ICD-10-CM | POA: Diagnosis not present

## 2021-12-27 DIAGNOSIS — M818 Other osteoporosis without current pathological fracture: Secondary | ICD-10-CM | POA: Diagnosis not present

## 2021-12-27 DIAGNOSIS — E7849 Other hyperlipidemia: Secondary | ICD-10-CM | POA: Diagnosis not present

## 2021-12-27 DIAGNOSIS — I10 Essential (primary) hypertension: Secondary | ICD-10-CM | POA: Diagnosis not present

## 2021-12-27 DIAGNOSIS — J449 Chronic obstructive pulmonary disease, unspecified: Secondary | ICD-10-CM | POA: Diagnosis not present

## 2022-02-15 ENCOUNTER — Ambulatory Visit (INDEPENDENT_AMBULATORY_CARE_PROVIDER_SITE_OTHER): Payer: Medicare Other | Admitting: Cardiology

## 2022-02-15 ENCOUNTER — Encounter: Payer: Self-pay | Admitting: *Deleted

## 2022-02-15 ENCOUNTER — Encounter: Payer: Self-pay | Admitting: Cardiology

## 2022-02-15 VITALS — BP 140/82 | HR 78 | Ht 63.0 in | Wt 137.4 lb

## 2022-02-15 DIAGNOSIS — I10 Essential (primary) hypertension: Secondary | ICD-10-CM

## 2022-02-15 DIAGNOSIS — I25119 Atherosclerotic heart disease of native coronary artery with unspecified angina pectoris: Secondary | ICD-10-CM | POA: Diagnosis not present

## 2022-02-15 NOTE — Progress Notes (Signed)
Cardiology Office Note  Date: 02/15/2022   ID: Yolanda, Gibson 1930-04-29, MRN [id number]  PCP:  Toma Deiters, MD  Cardiologist:  [name], MD Electrophysiologist:  None   Chief Complaint  Patient presents with   Cardiac follow-up    History of Present Illness: Yolanda Gibson is an 88 y.o. female last seen in October 2022 by Mr. Vincenza Hews NP (I last saw her in 2019).  She is here today with her son.  She does not describe any angina symptoms or nitroglycerin use.  No falls recently, continues to use her walker at home.  She reports compliance with her medications.  I reviewed her current regimen.  Plan to request interval lab work from PCP for review.  Past Medical History:  Diagnosis Date   Coronary atherosclerosis of native coronary artery    BMS LAD 4/11, residual RCA 70-80% mid   Dementia (HCC)    Mild   Essential hypertension, benign    Hyperlipidemia    Ischemic cardiomyopathy    LVEF up to 55% 11/13   Post-infarction apical thrombus (HCC)    Resolved   STEMI (ST elevation myocardial infarction) [facility])    Anterior 4/11   Vertigo     Past Surgical History:  Procedure Laterality Date   APPENDECTOMY     CHOLECYSTECTOMY     HIP SURGERY     RENAL BIOPSY      Current Outpatient Medications  Medication Sig Dispense Refill   aspirin 81 MG tablet Take 81 mg by mouth daily.     calcium-vitamin D (OSCAL WITH D) 500-200 MG-UNIT per tablet Take 1 tablet by mouth daily with breakfast.     carvedilol (COREG) 6.25 MG tablet TAKE 1 TABLET BY MOUTH 2 TIMES DAILY WITH A MEAL. 180 tablet 3   clopidogrel (PLAVIX) 75 MG tablet Take 75 mg by mouth daily.     lisinopril (ZESTRIL) 5 MG tablet TAKE 1 TABLET BY MOUTH EVERY DAY 90 tablet 1   meclizine (ANTIVERT) 25 MG tablet Take 25 mg by mouth 3 (three) times daily as needed.     nitroGLYCERIN (NITROSTAT) 0.4 MG SL tablet Place 1 tablet (0.4 mg total) under the tongue every 5 (five) minutes as needed. Up to 3  doses. If no relief after the 3 rd dose, proceed to the ED for an evaluation. 25 tablet 3   simvastatin (ZOCOR) 40 MG tablet TAKE 1 TABLET BY MOUTH AT BEDTIME 30 tablet 6   spironolactone (ALDACTONE) 25 MG tablet Take 0.5 tablets (12.5 mg total) by mouth daily. 15 tablet 6   No current facility-administered medications for this visit.   Allergies:  Penicillins   ROS: No orthopnea or PND.  Physical Exam: VS:  BP 140/82   Pulse 78   Ht 5\' 3"  (1.6 m)   Wt 137 lb 6.4 oz (62.3 kg)   SpO2 99%   BMI 24.34 kg/m , BMI Body mass index is 24.34 kg/m.  Wt Readings from Last 3 Encounters:  02/15/22 137 lb 6.4 oz (62.3 kg)  07/20/21 134 lb 6.4 oz (61 kg)  01/19/21 132 lb 6.4 oz (60.1 kg)    General: Patient appears comfortable at rest. HEENT: Conjunctiva and lids normal. Neck: Supple, no elevated JVP or carotid bruits, no thyromegaly. Lungs: Clear to auscultation, nonlabored breathing at rest. Cardiac: Regular rate and rhythm, no S3, 1/6 systolic murmur, no pericardial rub. Extremities: No pitting edema.  ECG:  An ECG dated 07/20/2021 was personally reviewed  today and demonstrated:  Sinus rhythm with low voltage and decreased R wave progression.  Recent Labwork:  December 2020: BUN 24, creatinine 0.64, potassium 4.6  Other Studies Reviewed Today:  No interval cardiac testing for review today.  Assessment and Plan:  1.  CAD status post BMS to the LAD in 2011 with moderate residual RCA disease that has been managed medically over time.  She does not report any angina with basic ADLs.  Continue aspirin, Plavix, lisinopril, Coreg, Aldactone, Zocor, and has a nitroglycerin.  Requesting interval lab work from PCP for review.  2.  Essential hypertension, systolic 140 today.  No changes were made to current regimen.  Medication Adjustments/Labs and Tests Ordered: Current medicines are reviewed at length with the patient today.  Concerns regarding medicines are outlined above.   Tests  Ordered: No orders of the defined types were placed in this encounter.   Medication Changes: No orders of the defined types were placed in this encounter.   Disposition:  Follow up  6 months.  Signed, Jonelle Sidle, MD, [facility] 02/15/2022 2:42 PM    [facility] at [facility] [address] [place], [place], [place] [address] Phone: [phone]; Fax: [phone]

## 2022-02-15 NOTE — Patient Instructions (Addendum)

## 2022-02-19 MED ORDER — ROSUVASTATIN CALCIUM 20 MG PO TABS: 20.0000 mg | ORAL_TABLET | Freq: Every day | ORAL | 1 refills | Status: AC

## 2022-03-29 DIAGNOSIS — Z0000 Encounter for general adult medical examination without abnormal findings: Secondary | ICD-10-CM | POA: Diagnosis not present

## 2022-03-29 DIAGNOSIS — I10 Essential (primary) hypertension: Secondary | ICD-10-CM | POA: Diagnosis not present

## 2022-03-29 DIAGNOSIS — I700 Atherosclerosis of aorta: Secondary | ICD-10-CM | POA: Diagnosis not present

## 2022-03-29 DIAGNOSIS — Z1389 Encounter for screening for other disorder: Secondary | ICD-10-CM | POA: Diagnosis not present

## 2022-03-29 DIAGNOSIS — J449 Chronic obstructive pulmonary disease, unspecified: Secondary | ICD-10-CM | POA: Diagnosis not present

## 2022-03-29 DIAGNOSIS — E7849 Other hyperlipidemia: Secondary | ICD-10-CM | POA: Diagnosis not present

## 2022-03-29 DIAGNOSIS — M818 Other osteoporosis without current pathological fracture: Secondary | ICD-10-CM | POA: Diagnosis not present

## 2022-04-16 ENCOUNTER — Other Ambulatory Visit: Payer: Self-pay | Admitting: Cardiology

## 2022-07-12 DIAGNOSIS — I10 Essential (primary) hypertension: Secondary | ICD-10-CM | POA: Diagnosis not present

## 2022-07-12 DIAGNOSIS — E7849 Other hyperlipidemia: Secondary | ICD-10-CM | POA: Diagnosis not present

## 2022-07-12 DIAGNOSIS — L570 Actinic keratosis: Secondary | ICD-10-CM | POA: Diagnosis not present

## 2022-07-12 DIAGNOSIS — J449 Chronic obstructive pulmonary disease, unspecified: Secondary | ICD-10-CM | POA: Diagnosis not present

## 2022-07-12 DIAGNOSIS — M818 Other osteoporosis without current pathological fracture: Secondary | ICD-10-CM | POA: Diagnosis not present

## 2022-07-12 DIAGNOSIS — I700 Atherosclerosis of aorta: Secondary | ICD-10-CM | POA: Diagnosis not present

## 2022-09-04 ENCOUNTER — Ambulatory Visit: Payer: Medicare Other | Attending: Cardiology | Admitting: Cardiology

## 2022-09-04 ENCOUNTER — Encounter: Payer: Self-pay | Admitting: Cardiology

## 2022-09-04 VITALS — BP 132/78 | HR 78 | Ht 63.0 in | Wt 132.6 lb

## 2022-09-04 DIAGNOSIS — I10 Essential (primary) hypertension: Secondary | ICD-10-CM | POA: Diagnosis not present

## 2022-09-04 DIAGNOSIS — I25119 Atherosclerotic heart disease of native coronary artery with unspecified angina pectoris: Secondary | ICD-10-CM | POA: Diagnosis not present

## 2022-09-04 NOTE — Progress Notes (Signed)
Cardiology Office Note  Date: 09/04/2022   ID: Ramiya, Viado 10-24-1929, MRN [id number]  PCP:  Toma Deiters, MD  Cardiologist:  [name], MD Electrophysiologist:  None   Chief Complaint  Patient presents with   Cardiac follow-up    History of Present Illness: Yolanda Gibson is a 88 y.o. female last seen in May.  She is here with her son today for a follow-up visit.  Reports no major change in status, no angina or nitroglycerin use, NYHA class II dyspnea with low-level activity.  She uses a wheelchair or walker to get around.  I reviewed her medications which are stable from a cardiac perspective.  Today's ECG shows sinus rhythm with old anterior infarct pattern.  She had lab work back in June with follow-up reassessment pending.  States that she has been compliant with her medications.  Past Medical History:  Diagnosis Date   Coronary atherosclerosis of native coronary artery    BMS LAD 4/11, residual RCA 70-80% mid   Dementia (HCC)    Mild   Essential hypertension, benign    Hyperlipidemia    Ischemic cardiomyopathy    LVEF up to 55% 11/13   Post-infarction apical thrombus (HCC)    Resolved   STEMI (ST elevation myocardial infarction) [facility])    Anterior 4/11   Vertigo     Past Surgical History:  Procedure Laterality Date   APPENDECTOMY     CHOLECYSTECTOMY     HIP SURGERY     RENAL BIOPSY      Current Outpatient Medications  Medication Sig Dispense Refill   aspirin 81 MG tablet Take 81 mg by mouth daily.     calcium-vitamin D (OSCAL WITH D) 500-200 MG-UNIT per tablet Take 1 tablet by mouth daily with breakfast.     carvedilol (COREG) 6.25 MG tablet TAKE 1 TABLET BY MOUTH 2 TIMES DAILY WITH A MEAL. 180 tablet 3   clopidogrel (PLAVIX) 75 MG tablet Take 75 mg by mouth daily.     lisinopril (ZESTRIL) 5 MG tablet TAKE 1 TABLET BY MOUTH EVERY DAY 90 tablet 1   meclizine (ANTIVERT) 25 MG tablet Take 25 mg by mouth 3 (three) times daily as  needed.     nitroGLYCERIN (NITROSTAT) 0.4 MG SL tablet Place 1 tablet (0.4 mg total) under the tongue every 5 (five) minutes as needed. Up to 3 doses. If no relief after the 3 rd dose, proceed to the ED for an evaluation. 25 tablet 3   rosuvastatin (CRESTOR) 20 MG tablet Take 1 tablet (20 mg total) by mouth daily. 90 tablet 1   spironolactone (ALDACTONE) 25 MG tablet Take 0.5 tablets (12.5 mg total) by mouth daily. 15 tablet 6   No current facility-administered medications for this visit.   Allergies:  Penicillins   ROS: No syncope.  Physical Exam: VS:  BP 132/78   Pulse 78   Ht 5\' 3"  (1.6 m)   Wt 132 lb 9.6 oz (60.1 kg)   SpO2 96%   BMI 23.49 kg/m , BMI Body mass index is 23.49 kg/m.  Wt Readings from Last 3 Encounters:  09/04/22 132 lb 9.6 oz (60.1 kg)  02/15/22 137 lb 6.4 oz (62.3 kg)  07/20/21 134 lb 6.4 oz (61 kg)    General: Patient appears comfortable at rest. HEENT: Conjunctiva and lids normal. Neck: Supple, no elevated JVP or carotid bruits. Lungs: Clear to auscultation, nonlabored breathing at rest. Cardiac: Regular rate and rhythm, no S3, 1/6  systolic murmur. Extremities: No pitting edema.  ECG:  An ECG dated 07/20/2021 was personally reviewed today and demonstrated:  Sinus rhythm with low voltage and decreased R wave progression.  Recent Labwork:  June 2023: BUN 25, creatinine 0.91, potassium 4.7, cholesterol 186, triglycerides 99, HDL 45, LDL 123  Other Studies Reviewed Today:  No interval cardiac testing for review today.  Assessment and Plan:  1.  CAD status post BMS to the LAD in 2011 with moderate residual RCA disease managed medically.  She has had no angina or interval nitroglycerin use.  Continue aspirin, Plavix, lisinopril, Coreg, Aldactone, and Crestor.  2.  Essential hypertension, blood pressure rechecked by me at 132/78.  No changes in current regimen.  Keep follow-up with PCP.  Medication Adjustments/Labs and Tests Ordered: Current medicines  are reviewed at length with the patient today.  Concerns regarding medicines are outlined above.   Tests Ordered: Orders Placed This Encounter  Procedures   EKG 12-Lead    Medication Changes: No orders of the defined types were placed in this encounter.   Disposition:  Follow up  6 months.  Signed, Jonelle Sidle, MD, [facility] 09/04/2022 1:52 PM     [facility] at [facility] [address] [place], [place], [place] [address] Phone: [phone]; Fax: [phone]

## 2022-09-04 NOTE — Patient Instructions (Signed)

## 2022-10-18 DIAGNOSIS — J449 Chronic obstructive pulmonary disease, unspecified: Secondary | ICD-10-CM | POA: Diagnosis not present

## 2022-10-18 DIAGNOSIS — I10 Essential (primary) hypertension: Secondary | ICD-10-CM | POA: Diagnosis not present

## 2022-10-18 DIAGNOSIS — M818 Other osteoporosis without current pathological fracture: Secondary | ICD-10-CM | POA: Diagnosis not present

## 2022-10-18 DIAGNOSIS — I700 Atherosclerosis of aorta: Secondary | ICD-10-CM | POA: Diagnosis not present

## 2022-10-18 DIAGNOSIS — L570 Actinic keratosis: Secondary | ICD-10-CM | POA: Diagnosis not present

## 2022-10-18 DIAGNOSIS — E7849 Other hyperlipidemia: Secondary | ICD-10-CM | POA: Diagnosis not present

## 2022-11-02 ENCOUNTER — Other Ambulatory Visit: Payer: Self-pay | Admitting: Cardiology

## 2022-11-16 ENCOUNTER — Other Ambulatory Visit: Payer: Self-pay | Admitting: Cardiology

## 2023-01-17 DIAGNOSIS — I700 Atherosclerosis of aorta: Secondary | ICD-10-CM | POA: Diagnosis not present

## 2023-01-17 DIAGNOSIS — M818 Other osteoporosis without current pathological fracture: Secondary | ICD-10-CM | POA: Diagnosis not present

## 2023-01-17 DIAGNOSIS — E7849 Other hyperlipidemia: Secondary | ICD-10-CM | POA: Diagnosis not present

## 2023-01-17 DIAGNOSIS — M25559 Pain in unspecified hip: Secondary | ICD-10-CM | POA: Diagnosis not present

## 2023-01-17 DIAGNOSIS — J449 Chronic obstructive pulmonary disease, unspecified: Secondary | ICD-10-CM | POA: Diagnosis not present

## 2023-01-17 DIAGNOSIS — I10 Essential (primary) hypertension: Secondary | ICD-10-CM | POA: Diagnosis not present

## 2023-05-28 DIAGNOSIS — M818 Other osteoporosis without current pathological fracture: Secondary | ICD-10-CM | POA: Diagnosis not present

## 2023-05-28 DIAGNOSIS — I10 Essential (primary) hypertension: Secondary | ICD-10-CM | POA: Diagnosis not present

## 2023-05-28 DIAGNOSIS — M25559 Pain in unspecified hip: Secondary | ICD-10-CM | POA: Diagnosis not present

## 2023-05-28 DIAGNOSIS — J449 Chronic obstructive pulmonary disease, unspecified: Secondary | ICD-10-CM | POA: Diagnosis not present

## 2023-05-28 DIAGNOSIS — Z1331 Encounter for screening for depression: Secondary | ICD-10-CM | POA: Diagnosis not present

## 2023-05-28 DIAGNOSIS — E7849 Other hyperlipidemia: Secondary | ICD-10-CM | POA: Diagnosis not present

## 2023-05-28 DIAGNOSIS — Z0000 Encounter for general adult medical examination without abnormal findings: Secondary | ICD-10-CM | POA: Diagnosis not present

## 2023-05-28 DIAGNOSIS — I700 Atherosclerosis of aorta: Secondary | ICD-10-CM | POA: Diagnosis not present

## 2023-06-20 ENCOUNTER — Other Ambulatory Visit: Payer: Self-pay | Admitting: Cardiology

## 2023-07-23 ENCOUNTER — Other Ambulatory Visit: Payer: Self-pay | Admitting: Cardiology

## 2023-08-06 ENCOUNTER — Other Ambulatory Visit: Payer: Self-pay | Admitting: Cardiology

## 2023-08-15 ENCOUNTER — Other Ambulatory Visit: Payer: Self-pay | Admitting: Cardiology

## 2023-09-03 DIAGNOSIS — I700 Atherosclerosis of aorta: Secondary | ICD-10-CM | POA: Diagnosis not present

## 2023-09-03 DIAGNOSIS — M25559 Pain in unspecified hip: Secondary | ICD-10-CM | POA: Diagnosis not present

## 2023-09-03 DIAGNOSIS — J449 Chronic obstructive pulmonary disease, unspecified: Secondary | ICD-10-CM | POA: Diagnosis not present

## 2023-09-03 DIAGNOSIS — N1831 Chronic kidney disease, stage 3a: Secondary | ICD-10-CM | POA: Diagnosis not present

## 2023-09-03 DIAGNOSIS — M818 Other osteoporosis without current pathological fracture: Secondary | ICD-10-CM | POA: Diagnosis not present

## 2023-09-03 DIAGNOSIS — E7849 Other hyperlipidemia: Secondary | ICD-10-CM | POA: Diagnosis not present

## 2023-09-03 DIAGNOSIS — I10 Essential (primary) hypertension: Secondary | ICD-10-CM | POA: Diagnosis not present

## 2023-10-08 DIAGNOSIS — N261 Atrophy of kidney (terminal): Secondary | ICD-10-CM | POA: Diagnosis not present

## 2023-10-08 DIAGNOSIS — U071 COVID-19: Secondary | ICD-10-CM | POA: Diagnosis not present

## 2023-10-08 DIAGNOSIS — R9431 Abnormal electrocardiogram [ECG] [EKG]: Secondary | ICD-10-CM | POA: Diagnosis not present

## 2023-10-08 DIAGNOSIS — I700 Atherosclerosis of aorta: Secondary | ICD-10-CM | POA: Diagnosis not present

## 2023-10-08 DIAGNOSIS — K5730 Diverticulosis of large intestine without perforation or abscess without bleeding: Secondary | ICD-10-CM | POA: Diagnosis not present

## 2023-10-11 DIAGNOSIS — I5031 Acute diastolic (congestive) heart failure: Secondary | ICD-10-CM | POA: Diagnosis present

## 2023-10-11 DIAGNOSIS — E876 Hypokalemia: Secondary | ICD-10-CM | POA: Diagnosis not present

## 2023-10-11 DIAGNOSIS — Z7401 Bed confinement status: Secondary | ICD-10-CM | POA: Diagnosis not present

## 2023-10-11 DIAGNOSIS — E870 Hyperosmolality and hypernatremia: Secondary | ICD-10-CM | POA: Diagnosis present

## 2023-10-11 DIAGNOSIS — E8729 Other acidosis: Secondary | ICD-10-CM | POA: Diagnosis not present

## 2023-10-11 DIAGNOSIS — R4182 Altered mental status, unspecified: Secondary | ICD-10-CM | POA: Diagnosis not present

## 2023-10-11 DIAGNOSIS — I21A1 Myocardial infarction type 2: Secondary | ICD-10-CM | POA: Diagnosis present

## 2023-10-11 DIAGNOSIS — R6521 Severe sepsis with septic shock: Secondary | ICD-10-CM | POA: Diagnosis present

## 2023-10-11 DIAGNOSIS — J9601 Acute respiratory failure with hypoxia: Secondary | ICD-10-CM | POA: Diagnosis not present

## 2023-10-11 DIAGNOSIS — I214 Non-ST elevation (NSTEMI) myocardial infarction: Secondary | ICD-10-CM | POA: Diagnosis not present

## 2023-10-11 DIAGNOSIS — R000 Tachycardia, unspecified: Secondary | ICD-10-CM | POA: Diagnosis not present

## 2023-10-11 DIAGNOSIS — J189 Pneumonia, unspecified organism: Secondary | ICD-10-CM | POA: Diagnosis not present

## 2023-10-11 DIAGNOSIS — R2689 Other abnormalities of gait and mobility: Secondary | ICD-10-CM | POA: Diagnosis not present

## 2023-10-11 DIAGNOSIS — K559 Vascular disorder of intestine, unspecified: Secondary | ICD-10-CM | POA: Diagnosis present

## 2023-10-11 DIAGNOSIS — Z66 Do not resuscitate: Secondary | ICD-10-CM | POA: Diagnosis not present

## 2023-10-11 DIAGNOSIS — K7200 Acute and subacute hepatic failure without coma: Secondary | ICD-10-CM | POA: Diagnosis present

## 2023-10-11 DIAGNOSIS — B9729 Other coronavirus as the cause of diseases classified elsewhere: Secondary | ICD-10-CM | POA: Diagnosis not present

## 2023-10-11 DIAGNOSIS — J1282 Pneumonia due to coronavirus disease 2019: Secondary | ICD-10-CM | POA: Diagnosis present

## 2023-10-11 DIAGNOSIS — E538 Deficiency of other specified B group vitamins: Secondary | ICD-10-CM | POA: Diagnosis not present

## 2023-10-11 DIAGNOSIS — D638 Anemia in other chronic diseases classified elsewhere: Secondary | ICD-10-CM | POA: Diagnosis present

## 2023-10-11 DIAGNOSIS — K921 Melena: Secondary | ICD-10-CM | POA: Diagnosis not present

## 2023-10-11 DIAGNOSIS — R404 Transient alteration of awareness: Secondary | ICD-10-CM | POA: Diagnosis not present

## 2023-10-11 DIAGNOSIS — R935 Abnormal findings on diagnostic imaging of other abdominal regions, including retroperitoneum: Secondary | ICD-10-CM | POA: Diagnosis not present

## 2023-10-11 DIAGNOSIS — K6389 Other specified diseases of intestine: Secondary | ICD-10-CM | POA: Diagnosis not present

## 2023-10-11 DIAGNOSIS — I480 Paroxysmal atrial fibrillation: Secondary | ICD-10-CM | POA: Diagnosis not present

## 2023-10-11 DIAGNOSIS — R7401 Elevation of levels of liver transaminase levels: Secondary | ICD-10-CM | POA: Diagnosis not present

## 2023-10-11 DIAGNOSIS — D62 Acute posthemorrhagic anemia: Secondary | ICD-10-CM | POA: Diagnosis present

## 2023-10-11 DIAGNOSIS — D509 Iron deficiency anemia, unspecified: Secondary | ICD-10-CM | POA: Diagnosis not present

## 2023-10-11 DIAGNOSIS — T380X5A Adverse effect of glucocorticoids and synthetic analogues, initial encounter: Secondary | ICD-10-CM | POA: Diagnosis not present

## 2023-10-11 DIAGNOSIS — N170 Acute kidney failure with tubular necrosis: Secondary | ICD-10-CM | POA: Diagnosis not present

## 2023-10-11 DIAGNOSIS — I482 Chronic atrial fibrillation, unspecified: Secondary | ICD-10-CM | POA: Diagnosis not present

## 2023-10-11 DIAGNOSIS — Z20822 Contact with and (suspected) exposure to covid-19: Secondary | ICD-10-CM | POA: Diagnosis not present

## 2023-10-11 DIAGNOSIS — I4891 Unspecified atrial fibrillation: Secondary | ICD-10-CM | POA: Diagnosis not present

## 2023-10-11 DIAGNOSIS — R55 Syncope and collapse: Secondary | ICD-10-CM | POA: Diagnosis not present

## 2023-10-11 DIAGNOSIS — R918 Other nonspecific abnormal finding of lung field: Secondary | ICD-10-CM | POA: Diagnosis not present

## 2023-10-11 DIAGNOSIS — E440 Moderate protein-calorie malnutrition: Secondary | ICD-10-CM | POA: Diagnosis not present

## 2023-10-11 DIAGNOSIS — A419 Sepsis, unspecified organism: Secondary | ICD-10-CM | POA: Diagnosis not present

## 2023-10-11 DIAGNOSIS — E861 Hypovolemia: Secondary | ICD-10-CM | POA: Diagnosis present

## 2023-10-11 DIAGNOSIS — Z01810 Encounter for preprocedural cardiovascular examination: Secondary | ICD-10-CM | POA: Diagnosis not present

## 2023-10-11 DIAGNOSIS — E860 Dehydration: Secondary | ICD-10-CM | POA: Diagnosis present

## 2023-10-11 DIAGNOSIS — A4189 Other specified sepsis: Secondary | ICD-10-CM | POA: Diagnosis present

## 2023-10-11 DIAGNOSIS — D72828 Other elevated white blood cell count: Secondary | ICD-10-CM | POA: Diagnosis present

## 2023-10-11 DIAGNOSIS — R0902 Hypoxemia: Secondary | ICD-10-CM | POA: Diagnosis not present

## 2023-10-11 DIAGNOSIS — E871 Hypo-osmolality and hyponatremia: Secondary | ICD-10-CM | POA: Diagnosis present

## 2023-10-11 DIAGNOSIS — J1289 Other viral pneumonia: Secondary | ICD-10-CM | POA: Diagnosis not present

## 2023-10-11 DIAGNOSIS — R6520 Severe sepsis without septic shock: Secondary | ICD-10-CM | POA: Diagnosis not present

## 2023-10-11 DIAGNOSIS — U071 COVID-19: Secondary | ICD-10-CM | POA: Diagnosis present

## 2023-10-12 DIAGNOSIS — U071 COVID-19: Secondary | ICD-10-CM | POA: Diagnosis not present

## 2023-10-12 DIAGNOSIS — E876 Hypokalemia: Secondary | ICD-10-CM | POA: Diagnosis not present

## 2023-10-12 DIAGNOSIS — N170 Acute kidney failure with tubular necrosis: Secondary | ICD-10-CM | POA: Diagnosis not present

## 2023-10-12 DIAGNOSIS — J9601 Acute respiratory failure with hypoxia: Secondary | ICD-10-CM | POA: Diagnosis not present

## 2023-10-12 DIAGNOSIS — J189 Pneumonia, unspecified organism: Secondary | ICD-10-CM | POA: Diagnosis not present

## 2023-10-12 DIAGNOSIS — I4891 Unspecified atrial fibrillation: Secondary | ICD-10-CM | POA: Diagnosis not present

## 2023-10-13 DIAGNOSIS — J189 Pneumonia, unspecified organism: Secondary | ICD-10-CM | POA: Diagnosis not present

## 2023-10-13 DIAGNOSIS — I4891 Unspecified atrial fibrillation: Secondary | ICD-10-CM | POA: Diagnosis not present

## 2023-10-13 DIAGNOSIS — N170 Acute kidney failure with tubular necrosis: Secondary | ICD-10-CM | POA: Diagnosis not present

## 2023-10-13 DIAGNOSIS — E876 Hypokalemia: Secondary | ICD-10-CM | POA: Diagnosis not present

## 2023-10-13 DIAGNOSIS — U071 COVID-19: Secondary | ICD-10-CM | POA: Diagnosis not present

## 2023-10-13 DIAGNOSIS — J9601 Acute respiratory failure with hypoxia: Secondary | ICD-10-CM | POA: Diagnosis not present

## 2023-10-21 DIAGNOSIS — E872 Acidosis, unspecified: Secondary | ICD-10-CM | POA: Diagnosis not present

## 2023-10-21 DIAGNOSIS — I2510 Atherosclerotic heart disease of native coronary artery without angina pectoris: Secondary | ICD-10-CM | POA: Diagnosis not present

## 2023-10-21 DIAGNOSIS — U071 COVID-19: Secondary | ICD-10-CM | POA: Diagnosis not present

## 2023-10-21 DIAGNOSIS — I4891 Unspecified atrial fibrillation: Secondary | ICD-10-CM | POA: Diagnosis not present

## 2023-10-21 DIAGNOSIS — A419 Sepsis, unspecified organism: Secondary | ICD-10-CM | POA: Diagnosis not present

## 2023-10-21 DIAGNOSIS — E870 Hyperosmolality and hypernatremia: Secondary | ICD-10-CM | POA: Diagnosis not present

## 2023-10-21 DIAGNOSIS — G9341 Metabolic encephalopathy: Secondary | ICD-10-CM | POA: Diagnosis not present

## 2023-10-21 DIAGNOSIS — E785 Hyperlipidemia, unspecified: Secondary | ICD-10-CM | POA: Diagnosis not present

## 2023-10-21 DIAGNOSIS — N179 Acute kidney failure, unspecified: Secondary | ICD-10-CM | POA: Diagnosis not present

## 2023-10-21 DIAGNOSIS — J1282 Pneumonia due to coronavirus disease 2019: Secondary | ICD-10-CM | POA: Diagnosis not present

## 2023-10-21 DIAGNOSIS — I10 Essential (primary) hypertension: Secondary | ICD-10-CM | POA: Diagnosis not present

## 2023-10-21 DIAGNOSIS — I214 Non-ST elevation (NSTEMI) myocardial infarction: Secondary | ICD-10-CM | POA: Diagnosis not present

## 2023-10-21 DIAGNOSIS — R6521 Severe sepsis with septic shock: Secondary | ICD-10-CM | POA: Diagnosis not present

## 2023-10-24 DIAGNOSIS — A419 Sepsis, unspecified organism: Secondary | ICD-10-CM | POA: Diagnosis not present

## 2023-10-24 DIAGNOSIS — U071 COVID-19: Secondary | ICD-10-CM | POA: Diagnosis not present

## 2023-10-24 DIAGNOSIS — G9341 Metabolic encephalopathy: Secondary | ICD-10-CM | POA: Diagnosis not present

## 2023-10-24 DIAGNOSIS — J1282 Pneumonia due to coronavirus disease 2019: Secondary | ICD-10-CM | POA: Diagnosis not present

## 2023-10-24 DIAGNOSIS — R6521 Severe sepsis with septic shock: Secondary | ICD-10-CM | POA: Diagnosis not present

## 2023-10-24 DIAGNOSIS — N179 Acute kidney failure, unspecified: Secondary | ICD-10-CM | POA: Diagnosis not present

## 2023-10-25 DIAGNOSIS — G9341 Metabolic encephalopathy: Secondary | ICD-10-CM | POA: Diagnosis not present

## 2023-10-25 DIAGNOSIS — J1282 Pneumonia due to coronavirus disease 2019: Secondary | ICD-10-CM | POA: Diagnosis not present

## 2023-10-25 DIAGNOSIS — U071 COVID-19: Secondary | ICD-10-CM | POA: Diagnosis not present

## 2023-10-25 DIAGNOSIS — R6521 Severe sepsis with septic shock: Secondary | ICD-10-CM | POA: Diagnosis not present

## 2023-10-25 DIAGNOSIS — A419 Sepsis, unspecified organism: Secondary | ICD-10-CM | POA: Diagnosis not present

## 2023-10-25 DIAGNOSIS — N179 Acute kidney failure, unspecified: Secondary | ICD-10-CM | POA: Diagnosis not present

## 2023-10-29 DIAGNOSIS — J1282 Pneumonia due to coronavirus disease 2019: Secondary | ICD-10-CM | POA: Diagnosis not present

## 2023-10-29 DIAGNOSIS — U071 COVID-19: Secondary | ICD-10-CM | POA: Diagnosis not present

## 2023-10-29 DIAGNOSIS — R6521 Severe sepsis with septic shock: Secondary | ICD-10-CM | POA: Diagnosis not present

## 2023-10-29 DIAGNOSIS — N179 Acute kidney failure, unspecified: Secondary | ICD-10-CM | POA: Diagnosis not present

## 2023-10-29 DIAGNOSIS — G9341 Metabolic encephalopathy: Secondary | ICD-10-CM | POA: Diagnosis not present

## 2023-10-29 DIAGNOSIS — A419 Sepsis, unspecified organism: Secondary | ICD-10-CM | POA: Diagnosis not present

## 2023-10-31 DIAGNOSIS — A419 Sepsis, unspecified organism: Secondary | ICD-10-CM | POA: Diagnosis not present

## 2023-10-31 DIAGNOSIS — N179 Acute kidney failure, unspecified: Secondary | ICD-10-CM | POA: Diagnosis not present

## 2023-10-31 DIAGNOSIS — U071 COVID-19: Secondary | ICD-10-CM | POA: Diagnosis not present

## 2023-10-31 DIAGNOSIS — J1282 Pneumonia due to coronavirus disease 2019: Secondary | ICD-10-CM | POA: Diagnosis not present

## 2023-10-31 DIAGNOSIS — R6521 Severe sepsis with septic shock: Secondary | ICD-10-CM | POA: Diagnosis not present

## 2023-10-31 DIAGNOSIS — G9341 Metabolic encephalopathy: Secondary | ICD-10-CM | POA: Diagnosis not present

## 2023-11-01 ENCOUNTER — Other Ambulatory Visit: Payer: Self-pay | Admitting: Cardiology

## 2023-11-04 ENCOUNTER — Telehealth: Payer: Self-pay | Admitting: *Deleted

## 2023-11-04 DIAGNOSIS — J1282 Pneumonia due to coronavirus disease 2019: Secondary | ICD-10-CM | POA: Diagnosis not present

## 2023-11-04 DIAGNOSIS — A419 Sepsis, unspecified organism: Secondary | ICD-10-CM | POA: Diagnosis not present

## 2023-11-04 DIAGNOSIS — U071 COVID-19: Secondary | ICD-10-CM | POA: Diagnosis not present

## 2023-11-04 DIAGNOSIS — G9341 Metabolic encephalopathy: Secondary | ICD-10-CM | POA: Diagnosis not present

## 2023-11-04 DIAGNOSIS — R6521 Severe sepsis with septic shock: Secondary | ICD-10-CM | POA: Diagnosis not present

## 2023-11-04 DIAGNOSIS — N179 Acute kidney failure, unspecified: Secondary | ICD-10-CM | POA: Diagnosis not present

## 2023-11-04 NOTE — Patient Outreach (Signed)
  Care Coordination   11/04/2023 Name: Yolanda Gibson MRN: [id number] DOB: March 07, 1930   Care Coordination Outreach Attempts:  An unsuccessful telephone outreach was attempted today to offer the patient information about available complex care management services.  Follow Up Plan:  Additional outreach attempts will be made to offer the patient complex care management information and services.   Encounter Outcome:  No Answer   Care Coordination Interventions:  No, not indicated. Staff message to scheduling care guide requesting outreach regarding Care Management program and to offer scheduling with RN Care Manager.     Demetrios Loll, RN, BSN [place]  [facility], [facility] Health RN Care Manager Direct Dial: [phone]

## 2023-11-06 DIAGNOSIS — A419 Sepsis, unspecified organism: Secondary | ICD-10-CM | POA: Diagnosis not present

## 2023-11-06 DIAGNOSIS — J1282 Pneumonia due to coronavirus disease 2019: Secondary | ICD-10-CM | POA: Diagnosis not present

## 2023-11-06 DIAGNOSIS — U071 COVID-19: Secondary | ICD-10-CM | POA: Diagnosis not present

## 2023-11-06 DIAGNOSIS — G9341 Metabolic encephalopathy: Secondary | ICD-10-CM | POA: Diagnosis not present

## 2023-11-06 DIAGNOSIS — N179 Acute kidney failure, unspecified: Secondary | ICD-10-CM | POA: Diagnosis not present

## 2023-11-06 DIAGNOSIS — R6521 Severe sepsis with septic shock: Secondary | ICD-10-CM | POA: Diagnosis not present

## 2023-11-07 DIAGNOSIS — G9341 Metabolic encephalopathy: Secondary | ICD-10-CM | POA: Diagnosis not present

## 2023-11-07 DIAGNOSIS — R6521 Severe sepsis with septic shock: Secondary | ICD-10-CM | POA: Diagnosis not present

## 2023-11-07 DIAGNOSIS — J1282 Pneumonia due to coronavirus disease 2019: Secondary | ICD-10-CM | POA: Diagnosis not present

## 2023-11-07 DIAGNOSIS — U071 COVID-19: Secondary | ICD-10-CM | POA: Diagnosis not present

## 2023-11-07 DIAGNOSIS — N179 Acute kidney failure, unspecified: Secondary | ICD-10-CM | POA: Diagnosis not present

## 2023-11-07 DIAGNOSIS — N3090 Cystitis, unspecified without hematuria: Secondary | ICD-10-CM | POA: Diagnosis not present

## 2023-11-07 DIAGNOSIS — A419 Sepsis, unspecified organism: Secondary | ICD-10-CM | POA: Diagnosis not present

## 2023-11-11 DIAGNOSIS — J1282 Pneumonia due to coronavirus disease 2019: Secondary | ICD-10-CM | POA: Diagnosis not present

## 2023-11-11 DIAGNOSIS — G9341 Metabolic encephalopathy: Secondary | ICD-10-CM | POA: Diagnosis not present

## 2023-11-11 DIAGNOSIS — U071 COVID-19: Secondary | ICD-10-CM | POA: Diagnosis not present

## 2023-11-11 DIAGNOSIS — A419 Sepsis, unspecified organism: Secondary | ICD-10-CM | POA: Diagnosis not present

## 2023-11-11 DIAGNOSIS — R6521 Severe sepsis with septic shock: Secondary | ICD-10-CM | POA: Diagnosis not present

## 2023-11-11 DIAGNOSIS — N179 Acute kidney failure, unspecified: Secondary | ICD-10-CM | POA: Diagnosis not present

## 2023-11-12 DIAGNOSIS — A419 Sepsis, unspecified organism: Secondary | ICD-10-CM | POA: Diagnosis not present

## 2023-11-12 DIAGNOSIS — R6521 Severe sepsis with septic shock: Secondary | ICD-10-CM | POA: Diagnosis not present

## 2023-11-12 DIAGNOSIS — G9341 Metabolic encephalopathy: Secondary | ICD-10-CM | POA: Diagnosis not present

## 2023-11-12 DIAGNOSIS — N179 Acute kidney failure, unspecified: Secondary | ICD-10-CM | POA: Diagnosis not present

## 2023-11-12 DIAGNOSIS — U071 COVID-19: Secondary | ICD-10-CM | POA: Diagnosis not present

## 2023-11-12 DIAGNOSIS — J1282 Pneumonia due to coronavirus disease 2019: Secondary | ICD-10-CM | POA: Diagnosis not present

## 2023-11-13 DIAGNOSIS — A419 Sepsis, unspecified organism: Secondary | ICD-10-CM | POA: Diagnosis not present

## 2023-11-13 DIAGNOSIS — R6521 Severe sepsis with septic shock: Secondary | ICD-10-CM | POA: Diagnosis not present

## 2023-11-13 DIAGNOSIS — G9341 Metabolic encephalopathy: Secondary | ICD-10-CM | POA: Diagnosis not present

## 2023-11-13 DIAGNOSIS — N179 Acute kidney failure, unspecified: Secondary | ICD-10-CM | POA: Diagnosis not present

## 2023-11-13 DIAGNOSIS — J1282 Pneumonia due to coronavirus disease 2019: Secondary | ICD-10-CM | POA: Diagnosis not present

## 2023-11-13 DIAGNOSIS — U071 COVID-19: Secondary | ICD-10-CM | POA: Diagnosis not present

## 2023-11-14 ENCOUNTER — Telehealth: Payer: Self-pay | Admitting: *Deleted

## 2023-11-14 DIAGNOSIS — G9341 Metabolic encephalopathy: Secondary | ICD-10-CM | POA: Diagnosis not present

## 2023-11-14 DIAGNOSIS — U071 COVID-19: Secondary | ICD-10-CM | POA: Diagnosis not present

## 2023-11-14 DIAGNOSIS — J1282 Pneumonia due to coronavirus disease 2019: Secondary | ICD-10-CM | POA: Diagnosis not present

## 2023-11-14 DIAGNOSIS — N179 Acute kidney failure, unspecified: Secondary | ICD-10-CM | POA: Diagnosis not present

## 2023-11-14 DIAGNOSIS — A419 Sepsis, unspecified organism: Secondary | ICD-10-CM | POA: Diagnosis not present

## 2023-11-14 DIAGNOSIS — R6521 Severe sepsis with septic shock: Secondary | ICD-10-CM | POA: Diagnosis not present

## 2023-11-14 NOTE — Progress Notes (Signed)
Complex Care Management Note  Care Guide Note 11/14/2023 Name: Yolanda Gibson MRN: [id number] DOB: June 15, 1930  Yolanda Gibson is a 89 y.o. year old female who sees Hasanaj, Myra Gianotti, MD for primary care. I reached out to [facility] son Reizel Calzada DPR on file by phone today to offer complex care management services.  Ms. Navis son Leya Paige DPR on file was given information about Complex Care Management services today including:   The Complex Care Management services include support from the care team which includes your Nurse Care Manager, Clinical Social Worker, or Pharmacist.  The Complex Care Management team is here to help remove barriers to the health concerns and goals most important to you. Complex Care Management services are voluntary, and the patient may decline or stop services at any time by request to their care team member.   Complex Care Management Consent Status: Patient son Bambie Pizzolato DPR on file agreed to services and verbal consent obtained.   Follow up plan:  Telephone appointment with complex care management team member scheduled for:  2/17  Encounter Outcome:  Patient Scheduled  Gwenevere Ghazi  [facility]  [facility], [facility] Guide  Direct Dial: [phone]  Fax [phone]

## 2023-11-15 DIAGNOSIS — A419 Sepsis, unspecified organism: Secondary | ICD-10-CM | POA: Diagnosis not present

## 2023-11-15 DIAGNOSIS — U071 COVID-19: Secondary | ICD-10-CM | POA: Diagnosis not present

## 2023-11-15 DIAGNOSIS — J1282 Pneumonia due to coronavirus disease 2019: Secondary | ICD-10-CM | POA: Diagnosis not present

## 2023-11-15 DIAGNOSIS — G9341 Metabolic encephalopathy: Secondary | ICD-10-CM | POA: Diagnosis not present

## 2023-11-15 DIAGNOSIS — R6521 Severe sepsis with septic shock: Secondary | ICD-10-CM | POA: Diagnosis not present

## 2023-11-15 DIAGNOSIS — N179 Acute kidney failure, unspecified: Secondary | ICD-10-CM | POA: Diagnosis not present

## 2023-11-18 ENCOUNTER — Ambulatory Visit: Payer: Self-pay | Admitting: *Deleted

## 2023-11-18 ENCOUNTER — Encounter: Payer: Self-pay | Admitting: *Deleted

## 2023-11-18 DIAGNOSIS — R6521 Severe sepsis with septic shock: Secondary | ICD-10-CM | POA: Diagnosis not present

## 2023-11-18 DIAGNOSIS — G9341 Metabolic encephalopathy: Secondary | ICD-10-CM | POA: Diagnosis not present

## 2023-11-18 DIAGNOSIS — A419 Sepsis, unspecified organism: Secondary | ICD-10-CM | POA: Diagnosis not present

## 2023-11-18 DIAGNOSIS — J1282 Pneumonia due to coronavirus disease 2019: Secondary | ICD-10-CM | POA: Diagnosis not present

## 2023-11-18 DIAGNOSIS — U071 COVID-19: Secondary | ICD-10-CM | POA: Diagnosis not present

## 2023-11-18 DIAGNOSIS — N179 Acute kidney failure, unspecified: Secondary | ICD-10-CM | POA: Diagnosis not present

## 2023-11-18 NOTE — Patient Outreach (Signed)
Care Coordination   Initial Visit Note   11/18/2023 Name: Taneasha Fuqua MRN: [id number] DOB: 1930-07-30  Ellison Hughs Hannula is a 89 y.o. year old female who sees Hasanaj, Myra Gianotti, MD for primary care. I  spoke with patient's son and caregiver, Kathlene November, by telephone today. Patient lives with her son in a one story home in [place], [place]. There are 3 steps into the house but they have a wheelchair ramp. Patient has weakness after covid infection in January 2025. She is working with HHPT.  What matters to the patients health and wellness today?  Caregiver has difficulty getting patient from wheelchair into car and from car into wheelchair. He has a transportation provider that he is going to reach out to this week. No other questions or concerns at this time.    Goals Addressed             This Visit's Progress    Care Management Needs       Care Management Goals: Caregiver will arrange transportation to provider visits Patient will continue to work with HHPT ever-other-day for strength and mobility training Caregiver will reach out to RN Care Manager at [phone] if transportation assistance is needed or if there are any other care management or resource needs        SDOH assessments and interventions completed:  Yes  SDOH Interventions Today    Flowsheet Row Most Recent Value  SDOH Interventions   Food Insecurity Interventions Intervention Not Indicated  Housing Interventions Intervention Not Indicated  Transportation Interventions Patient Resources (Friends/Family)  Utilities Interventions Intervention Not Indicated  Financial Strain Interventions Intervention Not Indicated  Physical Activity Interventions Intervention Not Indicated        Care Coordination Interventions:  Yes, provided  Interventions Today    Flowsheet Row Most Recent Value  Chronic Disease   Chronic disease during today's visit Hypertension (HTN), Other  [Weakness S/P covid infection in January  2025]  General Interventions   General Interventions Discussed/Reviewed General Interventions Discussed, General Interventions Reviewed, Labs, Durable Medical Equipment (DME), Vaccines, Doctor Visits, [employer] Kidney Function  Vaccines COVID-19, Flu, Pneumonia, RSV  Doctor Visits Discussed/Reviewed Doctor Visits Discussed, Doctor Visits Reviewed, Annual Wellness Visits, PCP, Database administrator (DME) BP Cuff, Wheelchair  Wheelchair Standard  PCP/Specialist Visits Compliance with follow-up visit  [Scheduled for visit with Dr Olena Leatherwood on 12/02/23]  Exercise Interventions   Exercise Discussed/Reviewed Exercise Discussed, Exercise Reviewed, Physical Activity, Assistive device use and maintanence  Physical Activity Discussed/Reviewed Physical Activity Discussed, Physical Activity Reviewed  [working with HHPT every-other-day]  Education Interventions   Education Provided Provided Education  Provided Verbal Education On Nutrition, Labs, Exercise, Medication, When to see the doctor, [employer] Reviewed Kidney Function, Lipid Profile  [potassium level]  Nutrition Interventions   Nutrition Discussed/Reviewed Nutrition Reviewed, Nutrition Discussed, Fluid intake, Supplemental nutrition, Adding fruits and vegetables, Increasing proteins  Pharmacy Interventions   Pharmacy Dicussed/Reviewed Pharmacy Topics Discussed, Pharmacy Topics Reviewed, Medications and their functions  [taking medications as prescribed. Reconciled medication list]  Safety Interventions   Safety Discussed/Reviewed Safety Discussed, Safety Reviewed, Fall Risk, Home Safety  Home Safety Assistive Devices  Advanced Directive Interventions   Advanced Directives Discussed/Reviewed Advanced Directives Discussed, Advanced Care Planning  [Encouraged to consider]       Follow up plan: Follow up call scheduled for 11/29/23    Encounter Outcome:  Patient Visit Completed   Demetrios Loll, RN,  BSN [place]  [facility],  Population Health Emergency planning/management officer Dial: [phone]

## 2023-11-20 DIAGNOSIS — I4891 Unspecified atrial fibrillation: Secondary | ICD-10-CM | POA: Diagnosis not present

## 2023-11-20 DIAGNOSIS — E872 Acidosis, unspecified: Secondary | ICD-10-CM | POA: Diagnosis not present

## 2023-11-20 DIAGNOSIS — I214 Non-ST elevation (NSTEMI) myocardial infarction: Secondary | ICD-10-CM | POA: Diagnosis not present

## 2023-11-20 DIAGNOSIS — E870 Hyperosmolality and hypernatremia: Secondary | ICD-10-CM | POA: Diagnosis not present

## 2023-11-20 DIAGNOSIS — I10 Essential (primary) hypertension: Secondary | ICD-10-CM | POA: Diagnosis not present

## 2023-11-20 DIAGNOSIS — I2510 Atherosclerotic heart disease of native coronary artery without angina pectoris: Secondary | ICD-10-CM | POA: Diagnosis not present

## 2023-11-20 DIAGNOSIS — N179 Acute kidney failure, unspecified: Secondary | ICD-10-CM | POA: Diagnosis not present

## 2023-11-20 DIAGNOSIS — A419 Sepsis, unspecified organism: Secondary | ICD-10-CM | POA: Diagnosis not present

## 2023-11-20 DIAGNOSIS — R6521 Severe sepsis with septic shock: Secondary | ICD-10-CM | POA: Diagnosis not present

## 2023-11-20 DIAGNOSIS — U071 COVID-19: Secondary | ICD-10-CM | POA: Diagnosis not present

## 2023-11-20 DIAGNOSIS — E785 Hyperlipidemia, unspecified: Secondary | ICD-10-CM | POA: Diagnosis not present

## 2023-11-20 DIAGNOSIS — J1282 Pneumonia due to coronavirus disease 2019: Secondary | ICD-10-CM | POA: Diagnosis not present

## 2023-11-20 DIAGNOSIS — G9341 Metabolic encephalopathy: Secondary | ICD-10-CM | POA: Diagnosis not present

## 2023-11-21 DIAGNOSIS — N179 Acute kidney failure, unspecified: Secondary | ICD-10-CM | POA: Diagnosis not present

## 2023-11-21 DIAGNOSIS — R6521 Severe sepsis with septic shock: Secondary | ICD-10-CM | POA: Diagnosis not present

## 2023-11-21 DIAGNOSIS — A419 Sepsis, unspecified organism: Secondary | ICD-10-CM | POA: Diagnosis not present

## 2023-11-21 DIAGNOSIS — U071 COVID-19: Secondary | ICD-10-CM | POA: Diagnosis not present

## 2023-11-21 DIAGNOSIS — G9341 Metabolic encephalopathy: Secondary | ICD-10-CM | POA: Diagnosis not present

## 2023-11-21 DIAGNOSIS — J1282 Pneumonia due to coronavirus disease 2019: Secondary | ICD-10-CM | POA: Diagnosis not present

## 2023-11-22 DIAGNOSIS — G9341 Metabolic encephalopathy: Secondary | ICD-10-CM | POA: Diagnosis not present

## 2023-11-22 DIAGNOSIS — R6521 Severe sepsis with septic shock: Secondary | ICD-10-CM | POA: Diagnosis not present

## 2023-11-22 DIAGNOSIS — U071 COVID-19: Secondary | ICD-10-CM | POA: Diagnosis not present

## 2023-11-22 DIAGNOSIS — A419 Sepsis, unspecified organism: Secondary | ICD-10-CM | POA: Diagnosis not present

## 2023-11-22 DIAGNOSIS — J1282 Pneumonia due to coronavirus disease 2019: Secondary | ICD-10-CM | POA: Diagnosis not present

## 2023-11-22 DIAGNOSIS — N179 Acute kidney failure, unspecified: Secondary | ICD-10-CM | POA: Diagnosis not present

## 2023-11-23 ENCOUNTER — Other Ambulatory Visit: Payer: Self-pay | Admitting: Cardiology

## 2023-11-25 DIAGNOSIS — D509 Iron deficiency anemia, unspecified: Secondary | ICD-10-CM | POA: Diagnosis not present

## 2023-11-26 DIAGNOSIS — N179 Acute kidney failure, unspecified: Secondary | ICD-10-CM | POA: Diagnosis not present

## 2023-11-26 DIAGNOSIS — U071 COVID-19: Secondary | ICD-10-CM | POA: Diagnosis not present

## 2023-11-26 DIAGNOSIS — J1282 Pneumonia due to coronavirus disease 2019: Secondary | ICD-10-CM | POA: Diagnosis not present

## 2023-11-26 DIAGNOSIS — A419 Sepsis, unspecified organism: Secondary | ICD-10-CM | POA: Diagnosis not present

## 2023-11-26 DIAGNOSIS — G9341 Metabolic encephalopathy: Secondary | ICD-10-CM | POA: Diagnosis not present

## 2023-11-26 DIAGNOSIS — R6521 Severe sepsis with septic shock: Secondary | ICD-10-CM | POA: Diagnosis not present

## 2023-11-27 DIAGNOSIS — G9341 Metabolic encephalopathy: Secondary | ICD-10-CM | POA: Diagnosis not present

## 2023-11-27 DIAGNOSIS — A419 Sepsis, unspecified organism: Secondary | ICD-10-CM | POA: Diagnosis not present

## 2023-11-27 DIAGNOSIS — R6521 Severe sepsis with septic shock: Secondary | ICD-10-CM | POA: Diagnosis not present

## 2023-11-27 DIAGNOSIS — N179 Acute kidney failure, unspecified: Secondary | ICD-10-CM | POA: Diagnosis not present

## 2023-11-27 DIAGNOSIS — U071 COVID-19: Secondary | ICD-10-CM | POA: Diagnosis not present

## 2023-11-27 DIAGNOSIS — J1282 Pneumonia due to coronavirus disease 2019: Secondary | ICD-10-CM | POA: Diagnosis not present

## 2023-11-28 DIAGNOSIS — G9341 Metabolic encephalopathy: Secondary | ICD-10-CM | POA: Diagnosis not present

## 2023-11-28 DIAGNOSIS — A419 Sepsis, unspecified organism: Secondary | ICD-10-CM | POA: Diagnosis not present

## 2023-11-28 DIAGNOSIS — N179 Acute kidney failure, unspecified: Secondary | ICD-10-CM | POA: Diagnosis not present

## 2023-11-28 DIAGNOSIS — J1282 Pneumonia due to coronavirus disease 2019: Secondary | ICD-10-CM | POA: Diagnosis not present

## 2023-11-28 DIAGNOSIS — U071 COVID-19: Secondary | ICD-10-CM | POA: Diagnosis not present

## 2023-11-28 DIAGNOSIS — R6521 Severe sepsis with septic shock: Secondary | ICD-10-CM | POA: Diagnosis not present

## 2023-11-29 ENCOUNTER — Encounter: Payer: Self-pay | Admitting: *Deleted

## 2023-11-29 ENCOUNTER — Ambulatory Visit: Payer: Self-pay | Admitting: *Deleted

## 2023-11-29 NOTE — Patient Outreach (Signed)
 Care Coordination   Follow Up Visit Note   11/29/2023 Name: Yolanda Gibson MRN: [id number] DOB: [birth year]  Yolanda Gibson is a 89 y.o. year old female who sees Hasanaj, Myra Gianotti, MD for primary care. I spoke with patient's son, Yolanda November, by telephone today.  What matters to the patients health and wellness today?  No questions or concerns expressed at this time. States that things are going well and that she is feeling much better.    Goals Addressed             This Visit's Progress    Care Management Needs   On track    Care Management Goals: Patient will continue to work with HHPT ever-other-day for strength and mobility training Caregiver will reach out to RN Care Manager at [phone] if transportation assistance is needed or if there are any other care management or resource needs        SDOH assessments and interventions completed:  Yes SDOH Interventions Today    Flowsheet Row Most Recent Value  SDOH Interventions   Transportation Interventions Patient Resources (Friends/Family), Other (Comment)  [[employer]]      Care Coordination Interventions:  Yes, provided  Interventions Today    Flowsheet Row Most Recent Value  Chronic Disease   Chronic disease during today's visit Other  [Weakness S/P covid infection in January 2025]  General Interventions   General Interventions Discussed/Reviewed General Interventions Discussed, General Interventions Reviewed, [employer], Doctor Visits, Horticulturist, commercial (DME)  Doctor Visits Discussed/Reviewed Doctor Visits Discussed, Doctor Visits Reviewed, PCP  Durable Medical Equipment (DME) Wheelchair  Wheelchair Standard  PCP/Specialist Visits Compliance with follow-up visit  [PCP appointment on 12/02/23. Son was able to schedule transportation through [employer].]  Exercise Interventions   Exercise Discussed/Reviewed Physical Activity, Exercise Discussed, Exercise Reviewed   Physical Activity Discussed/Reviewed Physical Activity Discussed, Physical Activity Reviewed  [continue to work with HHPT]  Education Interventions   Education Provided Provided Education  Provided Verbal Education On Mental Health/Coping with Illness, When to see the doctor, Dietitian Interventions   Safety Discussed/Reviewed Safety Discussed, Safety Reviewed, Financial risk analyst, Fall Risk  Home Safety Assistive Devices       Follow up plan: Follow up call scheduled for 12/27/23    Encounter Outcome:  Patient Visit Completed   Demetrios Loll, RN, BSN [place]  [facility], [facility] Health RN Care Manager Direct Dial: [phone]

## 2023-12-03 DIAGNOSIS — I10 Essential (primary) hypertension: Secondary | ICD-10-CM | POA: Diagnosis not present

## 2023-12-03 DIAGNOSIS — N1831 Chronic kidney disease, stage 3a: Secondary | ICD-10-CM | POA: Diagnosis not present

## 2023-12-03 DIAGNOSIS — E7849 Other hyperlipidemia: Secondary | ICD-10-CM | POA: Diagnosis not present

## 2023-12-03 DIAGNOSIS — M818 Other osteoporosis without current pathological fracture: Secondary | ICD-10-CM | POA: Diagnosis not present

## 2023-12-03 DIAGNOSIS — J449 Chronic obstructive pulmonary disease, unspecified: Secondary | ICD-10-CM | POA: Diagnosis not present

## 2023-12-03 DIAGNOSIS — I700 Atherosclerosis of aorta: Secondary | ICD-10-CM | POA: Diagnosis not present

## 2023-12-04 ENCOUNTER — Other Ambulatory Visit: Payer: Self-pay | Admitting: Cardiology

## 2023-12-04 DIAGNOSIS — N179 Acute kidney failure, unspecified: Secondary | ICD-10-CM | POA: Diagnosis not present

## 2023-12-04 DIAGNOSIS — A419 Sepsis, unspecified organism: Secondary | ICD-10-CM | POA: Diagnosis not present

## 2023-12-04 DIAGNOSIS — R6521 Severe sepsis with septic shock: Secondary | ICD-10-CM | POA: Diagnosis not present

## 2023-12-04 DIAGNOSIS — U071 COVID-19: Secondary | ICD-10-CM | POA: Diagnosis not present

## 2023-12-04 DIAGNOSIS — J1282 Pneumonia due to coronavirus disease 2019: Secondary | ICD-10-CM | POA: Diagnosis not present

## 2023-12-04 DIAGNOSIS — G9341 Metabolic encephalopathy: Secondary | ICD-10-CM | POA: Diagnosis not present

## 2023-12-05 DIAGNOSIS — U071 COVID-19: Secondary | ICD-10-CM | POA: Diagnosis not present

## 2023-12-05 DIAGNOSIS — A419 Sepsis, unspecified organism: Secondary | ICD-10-CM | POA: Diagnosis not present

## 2023-12-05 DIAGNOSIS — R6521 Severe sepsis with septic shock: Secondary | ICD-10-CM | POA: Diagnosis not present

## 2023-12-05 DIAGNOSIS — N179 Acute kidney failure, unspecified: Secondary | ICD-10-CM | POA: Diagnosis not present

## 2023-12-05 DIAGNOSIS — G9341 Metabolic encephalopathy: Secondary | ICD-10-CM | POA: Diagnosis not present

## 2023-12-05 DIAGNOSIS — J1282 Pneumonia due to coronavirus disease 2019: Secondary | ICD-10-CM | POA: Diagnosis not present

## 2023-12-07 DIAGNOSIS — J1282 Pneumonia due to coronavirus disease 2019: Secondary | ICD-10-CM | POA: Diagnosis not present

## 2023-12-07 DIAGNOSIS — R6521 Severe sepsis with septic shock: Secondary | ICD-10-CM | POA: Diagnosis not present

## 2023-12-07 DIAGNOSIS — N179 Acute kidney failure, unspecified: Secondary | ICD-10-CM | POA: Diagnosis not present

## 2023-12-07 DIAGNOSIS — G9341 Metabolic encephalopathy: Secondary | ICD-10-CM | POA: Diagnosis not present

## 2023-12-07 DIAGNOSIS — U071 COVID-19: Secondary | ICD-10-CM | POA: Diagnosis not present

## 2023-12-07 DIAGNOSIS — A419 Sepsis, unspecified organism: Secondary | ICD-10-CM | POA: Diagnosis not present

## 2023-12-09 DIAGNOSIS — N179 Acute kidney failure, unspecified: Secondary | ICD-10-CM | POA: Diagnosis not present

## 2023-12-09 DIAGNOSIS — U071 COVID-19: Secondary | ICD-10-CM | POA: Diagnosis not present

## 2023-12-09 DIAGNOSIS — J1282 Pneumonia due to coronavirus disease 2019: Secondary | ICD-10-CM | POA: Diagnosis not present

## 2023-12-09 DIAGNOSIS — E785 Hyperlipidemia, unspecified: Secondary | ICD-10-CM | POA: Diagnosis not present

## 2023-12-09 DIAGNOSIS — A419 Sepsis, unspecified organism: Secondary | ICD-10-CM | POA: Diagnosis not present

## 2023-12-09 DIAGNOSIS — R6521 Severe sepsis with septic shock: Secondary | ICD-10-CM | POA: Diagnosis not present

## 2023-12-09 DIAGNOSIS — G9341 Metabolic encephalopathy: Secondary | ICD-10-CM | POA: Diagnosis not present

## 2023-12-09 DIAGNOSIS — I4891 Unspecified atrial fibrillation: Secondary | ICD-10-CM | POA: Diagnosis not present

## 2023-12-10 DIAGNOSIS — J1282 Pneumonia due to coronavirus disease 2019: Secondary | ICD-10-CM | POA: Diagnosis not present

## 2023-12-10 DIAGNOSIS — R6521 Severe sepsis with septic shock: Secondary | ICD-10-CM | POA: Diagnosis not present

## 2023-12-10 DIAGNOSIS — U071 COVID-19: Secondary | ICD-10-CM | POA: Diagnosis not present

## 2023-12-10 DIAGNOSIS — A419 Sepsis, unspecified organism: Secondary | ICD-10-CM | POA: Diagnosis not present

## 2023-12-10 DIAGNOSIS — G9341 Metabolic encephalopathy: Secondary | ICD-10-CM | POA: Diagnosis not present

## 2023-12-10 DIAGNOSIS — N179 Acute kidney failure, unspecified: Secondary | ICD-10-CM | POA: Diagnosis not present

## 2023-12-12 DIAGNOSIS — U071 COVID-19: Secondary | ICD-10-CM | POA: Diagnosis not present

## 2023-12-12 DIAGNOSIS — R6521 Severe sepsis with septic shock: Secondary | ICD-10-CM | POA: Diagnosis not present

## 2023-12-12 DIAGNOSIS — J1282 Pneumonia due to coronavirus disease 2019: Secondary | ICD-10-CM | POA: Diagnosis not present

## 2023-12-12 DIAGNOSIS — N179 Acute kidney failure, unspecified: Secondary | ICD-10-CM | POA: Diagnosis not present

## 2023-12-12 DIAGNOSIS — G9341 Metabolic encephalopathy: Secondary | ICD-10-CM | POA: Diagnosis not present

## 2023-12-12 DIAGNOSIS — A419 Sepsis, unspecified organism: Secondary | ICD-10-CM | POA: Diagnosis not present

## 2023-12-13 DIAGNOSIS — A419 Sepsis, unspecified organism: Secondary | ICD-10-CM | POA: Diagnosis not present

## 2023-12-13 DIAGNOSIS — J1282 Pneumonia due to coronavirus disease 2019: Secondary | ICD-10-CM | POA: Diagnosis not present

## 2023-12-13 DIAGNOSIS — R6521 Severe sepsis with septic shock: Secondary | ICD-10-CM | POA: Diagnosis not present

## 2023-12-13 DIAGNOSIS — N179 Acute kidney failure, unspecified: Secondary | ICD-10-CM | POA: Diagnosis not present

## 2023-12-13 DIAGNOSIS — U071 COVID-19: Secondary | ICD-10-CM | POA: Diagnosis not present

## 2023-12-13 DIAGNOSIS — G9341 Metabolic encephalopathy: Secondary | ICD-10-CM | POA: Diagnosis not present

## 2023-12-17 DIAGNOSIS — N179 Acute kidney failure, unspecified: Secondary | ICD-10-CM | POA: Diagnosis not present

## 2023-12-17 DIAGNOSIS — U071 COVID-19: Secondary | ICD-10-CM | POA: Diagnosis not present

## 2023-12-17 DIAGNOSIS — R6521 Severe sepsis with septic shock: Secondary | ICD-10-CM | POA: Diagnosis not present

## 2023-12-17 DIAGNOSIS — G9341 Metabolic encephalopathy: Secondary | ICD-10-CM | POA: Diagnosis not present

## 2023-12-17 DIAGNOSIS — J1282 Pneumonia due to coronavirus disease 2019: Secondary | ICD-10-CM | POA: Diagnosis not present

## 2023-12-17 DIAGNOSIS — A419 Sepsis, unspecified organism: Secondary | ICD-10-CM | POA: Diagnosis not present

## 2023-12-19 DIAGNOSIS — N179 Acute kidney failure, unspecified: Secondary | ICD-10-CM | POA: Diagnosis not present

## 2023-12-19 DIAGNOSIS — A419 Sepsis, unspecified organism: Secondary | ICD-10-CM | POA: Diagnosis not present

## 2023-12-19 DIAGNOSIS — U071 COVID-19: Secondary | ICD-10-CM | POA: Diagnosis not present

## 2023-12-19 DIAGNOSIS — G9341 Metabolic encephalopathy: Secondary | ICD-10-CM | POA: Diagnosis not present

## 2023-12-19 DIAGNOSIS — J1282 Pneumonia due to coronavirus disease 2019: Secondary | ICD-10-CM | POA: Diagnosis not present

## 2023-12-19 DIAGNOSIS — R6521 Severe sepsis with septic shock: Secondary | ICD-10-CM | POA: Diagnosis not present

## 2023-12-27 ENCOUNTER — Encounter: Payer: Self-pay | Admitting: *Deleted

## 2023-12-27 ENCOUNTER — Ambulatory Visit: Payer: Self-pay | Admitting: *Deleted

## 2023-12-27 NOTE — Patient Outreach (Signed)
 Care Coordination   Follow Up Visit Note   12/27/2023 Name: Yolanda Gibson MRN: [id number] DOB: [birth year]  Yolanda Gibson is a 89 y.o. year old female who sees Hasanaj, Myra Gianotti, MD for primary care. I  spoke with patient's son/caregiver, Kathlene November, by telephone.  What matters to the patients health and wellness today?  Having a bowel movement    Goals Addressed             This Visit's Progress    Care Management Needs       Care Management Goals: Patient will continue to use wheelchair for ambulation and will stand and pivot to transfer to different seating position. Patient will keep all medical appointments Patient will reach out to provider with any new or worsening symptoms Caregiver will reach out to RN Care Manager at [phone] if transportation assistance is needed or if there are any other care management or resource needs     Improve Bowel Elimination       Care Management Goals: Patient will have a bowel movement within the next 24 hours Patient will increase water intake Patient will increase intake of fruits and vegetables Patient/son will state understanding that fiber (example: cereal) without liquids will bulk up the stool and increase risk for constipation Son will discuss current use of magnesium citrate with provider Patient/son will discuss other medication options with pharmacist or provider Patient/son will discuss constipation prevention and ongoing bowel elimination management with provider Patient can try warm prune juice Patient will seek medical attention for any new or worsening symptoms Son will reach out to RN Care Manager at [phone] as needed        SDOH assessments and interventions completed:  Yes  SDOH Interventions Today    Flowsheet Row Most Recent Value  SDOH Interventions   Transportation Interventions Patient Resources (Friends/Family), Other (Comment)  [[employer]]  Physical Activity Interventions  Intervention Not Indicated, Other (Comments)  [Worked with HHPT but that has ended. Unable to increase physical activity due to advanced age and physical condition.]        Care Coordination Interventions:  Yes, provided  Interventions Today    Flowsheet Row Most Recent Value  Chronic Disease   Chronic disease during today's visit Other  [Dementia, advanced Age, decreased mobility, wheelchair dependence, constipation]  General Interventions   General Interventions Discussed/Reviewed General Interventions Discussed, General Interventions Reviewed, Doctor Visits  [3rd day without a BM. Usually has one every 1 to 2 days.]  Doctor Visits Discussed/Reviewed Doctor Visits Discussed, Doctor Visits Reviewed, Specialist, PCP  PCP/Specialist Visits Compliance with follow-up visit  [Follow-up with PCP as recommended or sooner if needed. Cardiology visit scheduled for 01/29/24. Son arranges transportation through [employer].]  Exercise Interventions   Exercise Discussed/Reviewed Physical Activity, Assistive device use and maintanence  Physical Activity Discussed/Reviewed Physical Activity Discussed, Physical Activity Reviewed  [Pt. has worked with HHPT but that ended a few weeks ago. She is wheelchair dependent but she can stand to pivot to a different seating position. Patient was ambulatory with walker prior to hospitalization. Son understands that this may be her new normal.]  Education Interventions   Education Provided Provided Education  Provided Verbal Education On Nutrition, Medication, When to see the doctor, Exercise, Mental Health/Coping with Illness, Other  [Consitpation management. Decreased mobility increases risk for constipation. Reach out to provider if she doesn't have a BM within the next 24 hrs.]  Mental Health Interventions   Mental Health Discussed/Reviewed Mental Health Discussed, Mental  Health Reviewed, Other  [diagnosis of dementia. Per son, she seems content and is not  depressed or anxious.]  Nutrition Interventions   Nutrition Discussed/Reviewed Nutrition Discussed, Nutrition Reviewed, Adding fruits and vegetables, Fluid intake  [good appetite. Had biscuits and gravy for breakfast. Does drink water but does not eat a log of fruits and vegetables. Try warm prune juice to encourage bowel movement.]  Pharmacy Interventions   Pharmacy Dicussed/Reviewed Pharmacy Topics Discussed, Pharmacy Topics Reviewed, Medications and their functions  [tried small amount of magnesium citrate to encourage bowel movement with no progress. Cautioned against use without provider or pharmacy direction due to potential for complications. Talk with provider or pharmacist or about other options, like miralax.]  Safety Interventions   Safety Discussed/Reviewed Safety Discussed, Safety Reviewed, Fall Risk, Home Safety  Home Safety Assistive Devices       Follow up plan: Follow up call scheduled for 12/30/23    Encounter Outcome:  Patient Visit Completed   Demetrios Loll, RN, BSN [place]  [facility] RN Care Manager Direct Dial: [phone]  Fax: [phone]

## 2023-12-30 ENCOUNTER — Encounter: Payer: Self-pay | Admitting: *Deleted

## 2023-12-30 ENCOUNTER — Ambulatory Visit: Payer: Self-pay | Admitting: *Deleted

## 2023-12-30 NOTE — Patient Outreach (Signed)
 Care Coordination   Follow Up Visit Note   12/30/2023 Name: Yolanda Gibson MRN: [id number] DOB: December 12, 1929  Yolanda Gibson is a 89 y.o. year old female who sees Hasanaj, Myra Gianotti, MD for primary care. I  spoke with son, Yolanda Gibson, by telephone today to follow-up on reported constipation on Friday 11/29/23.  What matters to the patients health and wellness today?  Constipation has resolved    Goals Addressed             This Visit's Progress    Improve Bowel Elimination   On track    Care Management Goals: Patient will increase water intake to improve bowel movements Patient will increase intake of fruits and vegetables Patient/son will state understanding that fiber (example: cereal) without liquids will bulk up the stool and increase risk for constipation Son will discuss current use of magnesium citrate with provider Patient/son will discuss other medication options with pharmacist or provider Patient/son will discuss constipation prevention and ongoing bowel elimination management with provider Patient can try warm prune juice Patient will seek medical attention for any new or worsening symptoms Son will reach out to RN Care Manager at [phone] as needed        SDOH assessments and interventions completed:  No     Care Coordination Interventions:  Yes, provided  Interventions Today    Flowsheet Row Most Recent Value  Chronic Disease   Chronic disease during today's visit --  [Dementia, advanced Age, decreased mobility, wheelchair dependence, constipation]  General Interventions   General Interventions Discussed/Reviewed General Interventions Discussed, General Interventions Reviewed, Doctor Visits  [constipation has resolved for now. 2 normal BMs on Friday and normal BMs over the weekend.]  Doctor Visits Discussed/Reviewed Doctor Visits Discussed, Doctor Visits Reviewed, PCP  PCP/Specialist Visits Compliance with follow-up visit  Education Interventions    Education Provided Provided Education  Provided Verbal Education On Nutrition, Medication, When to see the doctor  [F/U with PCP with any new or worsening bowel symptoms]  Nutrition Interventions   Nutrition Discussed/Reviewed Nutrition Discussed, Nutrition Reviewed, Fluid intake, Adding fruits and vegetables  Pharmacy Interventions   Pharmacy Dicussed/Reviewed Pharmacy Topics Discussed, Pharmacy Topics Reviewed, Medications and their functions  [Has not had to take anything for constipation since a small dose of magnesium citrate on Friday. Discuss management options with provider or pharmacist.]       Follow up plan: Follow up call scheduled for 01/27/24    Encounter Outcome:  Patient Visit Completed   Demetrios Loll, RN, BSN Center Line  [facility] RN Care Manager Direct Dial: [phone]  Fax: [phone]

## 2024-01-27 ENCOUNTER — Encounter: Payer: Self-pay | Admitting: *Deleted

## 2024-01-28 ENCOUNTER — Encounter: Payer: Self-pay | Admitting: *Deleted

## 2024-01-29 ENCOUNTER — Encounter: Payer: Self-pay | Admitting: Cardiology

## 2024-01-29 ENCOUNTER — Ambulatory Visit: Attending: Cardiology | Admitting: Cardiology

## 2024-01-29 ENCOUNTER — Encounter: Payer: Self-pay | Admitting: *Deleted

## 2024-01-29 VITALS — BP 90/52 | HR 64 | Ht 63.0 in | Wt 109.0 lb

## 2024-01-29 DIAGNOSIS — I5032 Chronic diastolic (congestive) heart failure: Secondary | ICD-10-CM | POA: Diagnosis not present

## 2024-01-29 DIAGNOSIS — I25119 Atherosclerotic heart disease of native coronary artery with unspecified angina pectoris: Secondary | ICD-10-CM | POA: Insufficient documentation

## 2024-01-29 DIAGNOSIS — I10 Essential (primary) hypertension: Secondary | ICD-10-CM | POA: Insufficient documentation

## 2024-01-29 MED ORDER — SPIRONOLACTONE 25 MG PO TABS: 25.0000 mg | ORAL_TABLET | Freq: Every day | ORAL | 1 refills | Status: AC

## 2024-01-29 NOTE — Progress Notes (Signed)
    Cardiology Office Note  Date: 01/29/2024   ID: Adisson, Luebbering 1930/09/12, MRN [id number]  History of Present Illness: Yolanda Gibson is a 89 y.o. female last seen in December 2023.  She is here today with her son for a follow-up visit.  He tells me that she was hospitalized in [place] back in January with COVID-19 complicated by UTI, paroxysmal atrial fibrillation, and internal bleeding.  We are requesting the discharge summary.  She now lives with him, he provides her medications.  She has had an interval visit with her PCP.  We went over her medications.  Patient's son checks her blood pressure daily, reports systolics typically 115-125 range.  I reviewed her most recent lab work which is noted below.  She is in a wheelchair today.  Does not report any chest pain or palpitations.  Physical Exam: VS:  BP (!) 104/58 (BP Location: Left Arm, Patient Position: Sitting, Cuff Size: Normal)   Pulse 64   Ht 5\' 3"  (1.6 m)   Wt 109 lb (49.4 kg)   SpO2 97%   BMI 19.31 kg/m , BMI Body mass index is 19.31 kg/m.  Wt Readings from Last 3 Encounters:  01/29/24 109 lb (49.4 kg)  09/04/22 132 lb 9.6 oz (60.1 kg)  02/15/22 137 lb 6.4 oz (62.3 kg)    General: Patient appears comfortable at rest. HEENT: Conjunctiva and lids normal, . Neck: Supple, no elevated JVP or carotid bruits. Lungs: Clear to auscultation, nonlabored breathing at rest. Cardiac: Regular rate and rhythm, no S3, 1/6 systolic murmur. Extremities: No pitting edema.  ECG:  An ECG dated 09/04/2022 was personally reviewed today and demonstrated:  Sinus rhythm with old anterior infarct pattern.  Labwork:  August 2024: BUN 34, creatinine 1.15, potassium 4.7, GFR 45, cholesterol 190, triglycerides 103, HDL 37, LDL 134  Other Studies Reviewed Today:  No interval cardiac testing for review today.  Assessment and Plan:  1.  CAD status post a BMS to the LAD in 2011 with residual moderate RCA disease managed  medically.  No angina with low-level activity.  She is on Plavix  75 mg daily and Crestor  20 mg daily.  Also has as needed nitroglycerin  available.  2.  HFrecEF with history of ischemic cardiomyopathy in the setting of anterior STEMI in 2011 complicated by apical thrombus, resolved.  Continue regimen as noted below.  3.  Primary hypertension.  Tend to track blood pressure at home.  Could reduce dose of present regimen if necessary.  She is currently on Aldactone  25 mg daily, lisinopril  5 mg daily, and Coreg  6.25 mg twice daily.  4.  Mixed hyperlipidemia.  Currently on Crestor  20 mg daily with followed by PCP.  5.  CKD stage IIIb, creatinine 1.15 with GFR 45 in August 2024.  Disposition:  Follow up  6 months.  Signed, Gerard Knight, M.D., F.A.C.C. [facility]

## 2024-01-29 NOTE — Patient Instructions (Addendum)
 Medication Instructions:  Your physician has recommended you make the following change in your medication:  Change your spironolactone  to 25 mg daily Continue all other medications as prescribed  Labwork: none  Testing/Procedures: none  Follow-Up: Your physician recommends that you schedule a follow-up appointment in: 6 months  Any Other Special Instructions Will Be Listed Below (If Applicable).  If you need a refill on your cardiac medications before your next appointment, please call your pharmacy.

## 2024-01-30 ENCOUNTER — Telehealth: Payer: Self-pay

## 2024-01-30 NOTE — Progress Notes (Unsigned)
 Complex Care Management Note Care Guide Note  01/30/2024 Name: Yolanda Gibson MRN: [id number] DOB: May 25, 1930   Complex Care Management Outreach Attempts: An unsuccessful outreach was attempted for an appointment today.  Follow Up Plan:  Additional outreach attempts will be made to offer the patient complex care management information and services.   Encounter Outcome:  No Answer  [facility]  [facility], [facility] Care Management Assistant Direct Dial: [phone]  Fax: [phone]

## 2024-01-30 NOTE — Telephone Encounter (Signed)
-----   Message from Nurse Deloras Fess sent at 01/27/2024  1:22 PM EDT ----- Please contact to reschedule.

## 2024-02-03 NOTE — Progress Notes (Signed)
 Complex Care Management Note Care Guide Note  02/03/2024 Name: Yolanda Gibson MRN: [id number] DOB: 02/22/1930   Complex Care Management Outreach Attempts: An unsuccessful telephone outreach was attempted today to offer the patient information about available complex care management services.  Follow Up Plan:  No further outreach attempts will be made at this time. We have been unable to contact the patient to offer or enroll patient in complex care management services.  Encounter Outcome:  No Answer  [facility]  [facility], [facility] Health Care Management Assistant Direct Dial: [phone]  Fax: [phone]

## 2024-04-14 DIAGNOSIS — E7849 Other hyperlipidemia: Secondary | ICD-10-CM | POA: Diagnosis not present

## 2024-04-14 DIAGNOSIS — R630 Anorexia: Secondary | ICD-10-CM | POA: Diagnosis not present

## 2024-04-14 DIAGNOSIS — I10 Essential (primary) hypertension: Secondary | ICD-10-CM | POA: Diagnosis not present

## 2024-04-14 DIAGNOSIS — I700 Atherosclerosis of aorta: Secondary | ICD-10-CM | POA: Diagnosis not present

## 2024-04-14 DIAGNOSIS — N1831 Chronic kidney disease, stage 3a: Secondary | ICD-10-CM | POA: Diagnosis not present

## 2024-04-14 DIAGNOSIS — M818 Other osteoporosis without current pathological fracture: Secondary | ICD-10-CM | POA: Diagnosis not present

## 2024-04-14 DIAGNOSIS — J449 Chronic obstructive pulmonary disease, unspecified: Secondary | ICD-10-CM | POA: Diagnosis not present

## 2024-05-25 DIAGNOSIS — R059 Cough, unspecified: Secondary | ICD-10-CM | POA: Diagnosis not present

## 2024-05-25 DIAGNOSIS — N390 Urinary tract infection, site not specified: Secondary | ICD-10-CM | POA: Diagnosis not present

## 2024-05-25 DIAGNOSIS — R918 Other nonspecific abnormal finding of lung field: Secondary | ICD-10-CM | POA: Diagnosis not present

## 2024-05-25 DIAGNOSIS — Z66 Do not resuscitate: Secondary | ICD-10-CM | POA: Diagnosis not present

## 2024-05-25 DIAGNOSIS — Z7902 Long term (current) use of antithrombotics/antiplatelets: Secondary | ICD-10-CM | POA: Diagnosis not present

## 2024-05-25 DIAGNOSIS — I129 Hypertensive chronic kidney disease with stage 1 through stage 4 chronic kidney disease, or unspecified chronic kidney disease: Secondary | ICD-10-CM | POA: Diagnosis not present

## 2024-05-25 DIAGNOSIS — R0902 Hypoxemia: Secondary | ICD-10-CM | POA: Diagnosis not present

## 2024-05-25 DIAGNOSIS — I2510 Atherosclerotic heart disease of native coronary artery without angina pectoris: Secondary | ICD-10-CM | POA: Diagnosis not present

## 2024-05-25 DIAGNOSIS — R0989 Other specified symptoms and signs involving the circulatory and respiratory systems: Secondary | ICD-10-CM | POA: Diagnosis not present

## 2024-05-25 DIAGNOSIS — Z20822 Contact with and (suspected) exposure to covid-19: Secondary | ICD-10-CM | POA: Diagnosis not present

## 2024-05-25 DIAGNOSIS — Z792 Long term (current) use of antibiotics: Secondary | ICD-10-CM | POA: Diagnosis not present

## 2024-05-25 DIAGNOSIS — N3000 Acute cystitis without hematuria: Secondary | ICD-10-CM | POA: Diagnosis not present

## 2024-05-25 DIAGNOSIS — Z79899 Other long term (current) drug therapy: Secondary | ICD-10-CM | POA: Diagnosis not present

## 2024-05-25 DIAGNOSIS — R4182 Altered mental status, unspecified: Secondary | ICD-10-CM | POA: Diagnosis not present

## 2024-05-25 DIAGNOSIS — E785 Hyperlipidemia, unspecified: Secondary | ICD-10-CM | POA: Diagnosis not present

## 2024-05-25 DIAGNOSIS — Z1152 Encounter for screening for COVID-19: Secondary | ICD-10-CM | POA: Diagnosis not present

## 2024-05-25 DIAGNOSIS — N189 Chronic kidney disease, unspecified: Secondary | ICD-10-CM | POA: Diagnosis not present

## 2024-05-25 DIAGNOSIS — Z880 Allergy status to penicillin: Secondary | ICD-10-CM | POA: Diagnosis not present

## 2024-05-25 DIAGNOSIS — Z681 Body mass index (BMI) 19 or less, adult: Secondary | ICD-10-CM | POA: Diagnosis not present

## 2024-05-25 DIAGNOSIS — I10 Essential (primary) hypertension: Secondary | ICD-10-CM | POA: Diagnosis not present

## 2024-05-25 DIAGNOSIS — J189 Pneumonia, unspecified organism: Secondary | ICD-10-CM | POA: Diagnosis not present

## 2024-05-25 DIAGNOSIS — E43 Unspecified severe protein-calorie malnutrition: Secondary | ICD-10-CM | POA: Diagnosis not present

## 2024-05-27 NOTE — Nursing Note (Signed)
 Oxygen arranged through [employer]. TOC Will be taken up to patients room.

## 2024-05-27 NOTE — Discharge Summary (Signed)
 DISCHARGE SUMMARY [facility]   Discharge date:   May 27, 2024 Length of stay:    LOS: 2 days    Discharge Service:   [facility] Hospitalists Discharge Attending Physician: Margart Elsie Dragon, DO Discharge to:    To Home with Home Health w/ home O2 Condition at Discharge:  fair Code status:                         DNR and DNI    ______________________________________  Admission HPI    Patient admitted on: 05/25/2024  9:32 AM  Patient admitted by: Margart Elsie Dragon, DO    CHIEF COMPLAINT: Increased confusion, decreased appetite, and weakness   Day of admission HPI:  Yolanda Gibson  is a 89 y.o. female with a PMH significant for recent UTI, CAD, hypertension, and hyperlipidemia who presented with 1 to 2 weeks of decreased appetite, decreased mentation, and overall weakness.  Patient brought into the ED by her sons.  She does live with son Kenedie Dirocco.  Per report her mom had been doing well up until about 2 weeks ago.  She normally is able to transfer herself to the wheelchair and they assist with ADLs.  About 2 weeks ago she started to develop signs of UTI with increased confusion, increased lethargy, and decreased appetite.  Patient saw Dr. Orpha who placed patient on Keflex for UTI.  Son states that it seemed like it was getting better however over the past few days her symptoms have returned.  Patient also reports some associated shortness of breath, cough, and increased fatigue.  They deny any fever, chills, nausea or vomiting at home.  She does state that she just has had no appetite.  Discussed hospital admission with patient and patient's son who all are in agreement.  Discussed all x-ray findings and lab work at bedside.   Patient admitted on Home O2? - no Patient on home anticoagulant? -  no Patient admitted with Chronic home foley catheter? - no Foley catheter placed or replaced by another service prior to admission? - no Central Line Status: NONE    Mental Status on Admission: The patient is Alert and oriented to PERSON The patient is Alert And oriented to TIME The patient is Alert and oriented to LOCATION   Problem List, Assessment & Plan     ASSESSMENT & PLAN (In order of descending acuity)   Community-acquired pneumonia - Chest x-ray with low lung volumes and opacity overlying medial right lower lung and left lung base, possible developing pneumonia - Symptoms have gradually started to improve, fluctuating but did have a relatively good day with meals yesterday 8/26 - Urine cultures growing Proteus vulgaris, will continue to follow and adjust antibiotics outpatient if needed, will discharge home with Levaquin to cover both pulmonary and urinary source of infection - Rapid viral swab negative - Oxygen saturations fluctuated, patient did require 1 L of oxygen at rest at the time of discharge likely due to underlying pulmonary process expected to improved.  She has been arranged home oxygen with case management.   Acute cystitis without hematuria - Urinalysis grossly positive for UTI with moderate leuk esterase, and positive nitrites - Patient received 2 days IV Rocephin, will transition to p.o. Levaquin to cover both urinary and respiratory sources, continue to follow culture   Hypertension/CAD - Continue patient's home Plavix  and spironolactone  - No complaint of chest pain and blood pressure within desired limits   Severe  protein calorie malnutrition - Appreciate RD assistance with supplements - Patient meets severe protein calorie malnutrition by glim criteria with BMI of 20 in this 89 year old female with chronic disease - Increases risk of morbidity and mortality   Incidental Findings for ourpatient Follow-Up: No significant incidental findings present    ADDITIONAL NON-ACUTE FINDINGS, OBSERVATIONS, FAMILY DISCUSSIONS, ETC. (When present):   Alert, oriented, and pleasantly conversant 89 year old female seen and evaluated  this morning on hospital bed, discussed care with both patient's sons Ozell and [place].  After shared decision making they would like to proceed with discharge home.  Patient did qualify for 1 L of oxygen at rest. No respiratory distress, scattered rhonchi but fairly clear and improved lung sounds compared to previous exam Heart regular rate and rhythm, no murmur Abdomen soft, nontender, nondistended Thin extremities, severe muscle wasting   DVT Prophylaxis Ordered: SQ Enoxaparin ______________________________________  Timeline of Significant Events: No notes on file   Mental Status On day of Discharge:  The patient is Alert and oriented to PERSON The patient is Alert And oriented to TIME The patient is Alert and oriented to LOCATION  CODE STATUS :                    DNR and DNI   An advanced care planning discussion was  had with patient and/or patient's decisions maker (documented separately).  Patient discharged on Home O2? - yes Patient discharged on home anticoagulant? -  no  Foley Catheter status: None Central Line Status: NONE  Time Spent on Discharge I spent greater than 30 minutes counseling and coordinating care for the discharge of this patient. The patient, and sons Ozell and [place], and I discussed the importance of outpatient follow-up as well as concerning signs and symptoms that would require immediate evaluation by a medical professional. The aforementioned conversation participants understand  and did show insight. I did use teachback to ensure understanding. The above participant/s is aware that not following the discussed plan, recommendations, and follow up can lead to severe negative effects on the patient's health, up to and including death.  Discharge Medications     Your Medication List     START taking these medications    levoFLOXacin 750 MG tablet Commonly known as: LEVAQUIN Take 1 tablet (750 mg total) by mouth daily for 7 days.        _____________________________________  Nutrition:                                  Diet Instructions     Discharge diet (specify)     Discharge Nutrition Therapy: Heart Healthy                     ___________________________________________  Discharge Instructions   Nutrition:                                  Diet Instructions     Discharge diet (specify)     Discharge Nutrition Therapy: Heart Healthy       Activity:                                   Activity Instructions     Activity as tolerated  Appointments:                          Follow Up:                              Follow Up instructions and Outpatient Referrals    Ambulatory Referral to Home Health     Reason for referral: Home Health   Physician to follow patient's care: PCP   Disciplines requested:  Nursing Medical Social Work Home Health Aide     Nursing requested: Teaching/skilled observation and assessment   What teaching is needed (new diagnosis? new medications?): Chronic  illness, age debility   Medical Social Work requested: General area of patient need   General area of patient need: Resources and goals of care   Requested [facility] Date: 05/28/2024   Requested follow up plan: You would evaluate and manage.   Ambulatory Referral to Home Health     Reason for referral: home health   Physician to follow patient's care: PCP Comment - Hasanaj to sign orders   Disciplines requested:  Nursing Medical Social Work Home Health Aide     Nursing requested: Teaching/skilled observation and assessment   What teaching is needed (new diagnosis? new medications?): new diagnosis   Medical Social Work requested: General area of patient need   General area of patient need: Resources   Requested [facility] Date: 05/29/2024   Requested follow up plan: You would evaluate and manage.   Ambulatory Referral to Palliative Care     Reason for referral: Serious illness   Is this a palliative referral for:   symptom management goals of care coping support     Requested follow up plan: You would evaluate and manage.   Call MD for:  difficulty breathing, headache or visual disturbances     Call MD for:  extreme fatigue     Call MD for:  persistent dizziness or light-headedness     Call MD for:  persistent nausea or vomiting     Call MD for:  severe uncontrolled pain     Call MD for: Temperature > 38.5 Celsius ( > 101.3 Fahrenheit)     Discharge instructions         Allergies  Allergen Reactions  . Penicillins     REACTION: rash     Past Medical History[1]  Past Surgical History[2]   Family History[3]   Current Medications[4]  Imaging  ECG 12 Lead Result Date: 05/25/2024 Sinus rhythm with premature atrial complexes Anterior infarct , age undetermined Abnormal ECG When compared with ECG of 08-Oct-2023 16:11, premature atrial complexes are now present QRS duration has increased Anterior infarct is now present Criteria for Inferior infarct are no longer present Nonspecific T wave abnormality no longer evident in Lateral leads QT has lengthened Confirmed by Cherie Searle ([id number]) on 05/25/2024 1:22:48 PM  CT head without contrast Result Date: 05/25/2024 Exam:  CT Head without Contrast  History:  AMS  Technique: Routine brain CT without IV contrast. AEC (automated exposure control) and/or manual techniques such as size-specific kV and mAs are employed where appropriate to reduce radiation exposure for all CT exams.  Comparison:  None.  Findings:   BRAIN:  No CT evidence of acute infarction, hemorrhage, edema, mass or mass effect. Generalized white matter hypodensities indicate advanced chronic white matter microangiopathic chronic senescent change. Ventricles and basilar cisterns are unremarkable.  SOFT TISSUES:  Negative.  CALVARIUM:  Negative. No fracture. SINUSES AND MASTOIDS:  No mucosal thickening or fluid.    Advanced chronic white matter microangiopathic senescent changes. No acute  intracranial abnormalities suggested  Signed (Electronic Signature): 05/25/2024 10:55 AM Signed By: Dow JAYSON Agee, MD  XR Chest Portable Result Date: 05/25/2024 Exam:  Portable Chest  History:  89 year old with cough and nausea.  Technique:  Single frontal view.  Comparison:  06/03/2013  Findings:   There is low inspiratory lung volume. There is patchy opacity overlying the medial right lower lung and left lung base. The heart and mediastinum are unremarkable. No obvious pulmonary edema. No pneumothorax suspected. Cannot exclude trace effusion.  The bony structures are stable.    Low lung volume with opacity overlying the medial right lower lung and left lung base. This may represent atelectasis, however pneumonia is not excluded. Follow-up two-view chest could be obtained to ensure resolution.  Signed (Electronic Signature): 05/25/2024 10:16 AM Signed By: [place] JONETTA Quale, MD   Lab Results   Recent Labs    05/27/24 0530  WBC 5.3  HGB 11.9  HCT 37.2  PLT 147   Recent Labs    05/25/24 1034 05/26/24 0455 05/27/24 0530  NA 146*   < > 145  K 3.3*   < > 3.6  CL 102   < > 106  CO2 36.5*   < > 35.9*  BUN 15   < > 16  CREATININE 0.79   < > 0.83  GLU 117   < > 101  CALCIUM  9.8   < > 9.1  ALBUMIN 2.6*   < > 2.1*  PROT 6.9   < > 5.7*  BILITOT 0.8   < > 0.3  AST 33   < > 29  ALT 16   < > 13  ALKPHOS 111   < > 84  LACTATE 1.5  --   --    < > = values in this interval not displayed.   No results for input(s): CKTOTAL, CKMB, PCTCKMB, TROPONINI, EDTPNI, BNP, INR, LABPROT, APTT, DDIMER in the last 72 hours. Recent Labs    05/25/24 1126  WBCUA 50*  NITRITE Positive*  LEUKOCYTESUR Moderate*  BACTERIA Occasional*  RBCUA 4*  BLOODU Negative  GLUCOSEU Negative  PROTEINUA 300 mg/dL*  KETONESU Negative   No results for input(s): OPIAU, BENZU, TRICYCLIC, PCPU, AMPHU, COCAU, CANNAU, BARBU, ETOH, ACETAMIN, SALICYLATE in the last 72 hours. No  results for input(s): PREGTESTUR, PREGPOC in the last 72 hours. No results for input(s): OCCULTBLD, RAPSCRN, CDIFRPCR, CDIFFNAP1, A1C, CHOL, LDL, HDL, TRIG in the last 72 hours. No results for input(s): O2SOUR, FIO2ART, PHART, PCO2ART, PO2ART, HCO3ART, O2SATART, BEART in the last 72 hours. Pending Labs     Order Current Status   Blood Culture Preliminary result   Blood Culture Preliminary result   Urine Culture Preliminary result       Home Medications   Prior to Admission medications  Medication Dose, Route, Frequency  levoFLOXacin (LEVAQUIN) 750 MG tablet 750 mg, Oral, Daily (standard)   Margart Dragon, DO Hospitalist, [facility] 05/27/24, 1:31 PM       [1] No past medical history on file. [2] No past surgical history on file. [3] History reviewed. No pertinent family history. [4]  Current Facility-Administered Medications:  .  acetaminophen (TYLENOL) tablet 650 mg, 650 mg, Oral, Q4H PRN, Vendura, Khoury William, DO, 650 mg at 05/27/24 0748 .  [START ON 05/28/2024] azithromycin (ZITHROMAX) tablet 500 mg, 500 mg, Oral, Q24H SCH,  Feliz Margart Fallow, DO .  cefTRIAXone (ROCEPHIN) IVPB 1 g in 50 mL dextrose (premix), 1 g, Intravenous, Q24H, Vendura, Khoury William, DO, Stopped at 05/27/24 [phone] .  clopidogrel  (PLAVIX ) tablet 75 mg, 75 mg, Oral, Daily, Feliz Margart Fallow, DO, 75 mg at 05/27/24 0748 .  enoxaparin (LOVENOX) syringe 30 mg, 30 mg, Subcutaneous, Q24H, Vendura, Khoury William, DO, 30 mg at 05/26/24 1542 .  ipratropium-albuterol (DUO-NEB) 0.5-2.5 mg/3 mL nebulizer solution 3 mL, 3 mL, Nebulization, Q4H PRN, Feliz Margart Fallow, DO, 3 mL at 05/27/24 0924 .  ondansetron (ZOFRAN) injection 4 mg, 4 mg, Intravenous, Q8H PRN **OR** ondansetron (ZOFRAN) injection 8 mg, 8 mg, Intravenous, Q8H PRN, Feliz Margart Fallow, DO .  spironolactone  (ALDACTONE ) tablet 25 mg, 25 mg, Oral, Daily, Feliz Margart Fallow, DO, 25 mg at  05/27/24 [phone]

## 2024-05-27 NOTE — Nursing Note (Signed)
   05/27/24 1131  Rapid Rounds  Attendance Nursing;Physician;Advanced Practice Provider (APP);Case Manager;Dietitian/Nutrition Services;Occupational Therapy;Pharmacy;Respiratory Therapy;Social Work/Services;Speech Language Pathology  Expected Discharge Disposition [facility] Services  Today we still await: Clinical stability   Possible discharge home today with HH. HH referral was sent 8/26 to [facility] for RN/SW/aide. Referral was sent to [facility] for palliative. Has DME at home already. Qualified for oxygen, so will need oxygen arranged at discharge.

## 2024-05-28 ENCOUNTER — Telehealth: Payer: Self-pay

## 2024-05-28 NOTE — Transitions of Care (Post Inpatient/ED Visit) (Signed)
   05/28/2024  Name: Graceann Boileau MRN: [id number] DOB: 1929/11/29  Today's TOC FU Call Status: Today's TOC FU Call Status:: Unsuccessful Call (1st Attempt) Unsuccessful Call (1st Attempt) Date: 05/28/24  Attempted to reach the patient regarding the most recent Inpatient/ED visit.  Follow Up Plan: Additional outreach attempts will be made to reach the patient to complete the Transitions of Care (Post Inpatient/ED visit) call.   Kaily Wragg J. Niza Soderholm RN, MSN [facility], [facility] Health RN Care Manager Direct Dial: [phone]  Fax: [phone] Website: [email or web address]

## 2024-05-29 ENCOUNTER — Telehealth: Payer: Self-pay

## 2024-05-29 NOTE — Transitions of Care (Post Inpatient/ED Visit) (Signed)
 05/29/2024  Name: Yolanda Gibson MRN: [id number] DOB: Feb 12, 1930  Today's TOC FU Call Status: Today's TOC FU Call Status:: Successful TOC FU Call Completed TOC FU Call Complete Date: 05/29/24 Patient's Name and Date of Birth confirmed.  Transition Care Management Follow-up Telephone Call Date of Discharge: 05/27/24 Discharge Facility: Other [facility]) Name of Other (Non[facility]) Discharge Facility: [facility] Type of Discharge: Inpatient Admission Primary Inpatient Discharge Diagnosis:: Community-acquired pneumonia How have you been since you were released from the hospital?: Better Any questions or concerns?: No  Items Reviewed: Did you receive and understand the discharge instructions provided?: Yes Medications obtained,verified, and reconciled?: Yes (Medications Reviewed) Any new allergies since your discharge?: No Dietary orders reviewed?: Yes Type of Diet Ordered:: low sodium Do you have support at home?: Yes People in Home [RPT]: child(ren), adult Name of Support/Comfort Primary Source: [place] [employer]  Medications Reviewed Today: Medications Reviewed Today     Reviewed by Annaliyah Willig, RN (Case Manager) on 05/29/24 at 0915  Med List Status: <None>   Medication Order Taking? Sig Documenting Provider Last Dose Status Informant  calcium -vitamin D (OSCAL WITH D) 500-200 MG-UNIT per tablet [id number]  Take 1 tablet by mouth daily with breakfast.  Patient not taking: Reported on 05/29/2024   [provider]  Active   carvedilol  (COREG ) 6.25 MG tablet [id number] Yes TAKE 1 TABLET BY MOUTH TWICE A DAY WITH FOOD Debera Jayson MATSU, MD  Active   clopidogrel  (PLAVIX ) 75 MG tablet [id number] Yes Take 75 mg by mouth daily. [provider]  Active   FOLIC ACID PO [id number] Yes Take 1 tablet by mouth daily. [provider]  Active Child  levofloxacin (LEVAQUIN) 750 MG tablet [id number] Yes Take 750 mg by mouth daily. [provider]   Active   lisinopril  (ZESTRIL ) 5 MG tablet [id number] Yes TAKE 1 TABLET BY MOUTH EVERY DAY Debera Jayson MATSU, MD  Active   nitroGLYCERIN  (NITROSTAT ) 0.4 MG SL tablet [id number] Yes Place 1 tablet (0.4 mg total) under the tongue every 5 (five) minutes as needed. Up to 3 doses. If no relief after the 3 rd dose, proceed to the ED for an evaluation. Debera Jayson MATSU, MD  Active   pantoprazole  (PROTONIX ) 40 MG tablet [id number] Yes Take 40 mg by mouth daily. [provider]  Active   rosuvastatin  (CRESTOR ) 20 MG tablet [id number]  Take 1 tablet (20 mg total) by mouth daily.  Patient not taking: Reported on 05/29/2024   Debera Jayson MATSU, MD  Active   spironolactone  (ALDACTONE ) 25 MG tablet [id number]  Take 1 tablet (25 mg total) by mouth daily.  Patient not taking: Reported on 05/29/2024   Debera Jayson MATSU, MD  Active   sucralfate (CARAFATE) 1 GM/10ML suspension [id number]  Take 1 g by mouth 4 (four) times daily -  with meals and at bedtime.  Patient not taking: Reported on 05/29/2024   [provider]  Active             Home Care and Equipment/Supplies: Were Home Health Services Ordered?: Yes Name of Home Health Agency:: [facility] Has Agency set up a time to come to your home?: No EMR reviewed for Home Health Orders:  (son reports he missed call yesterday but will be calling today) Any new equipment or medical supplies ordered?: Yes Name of Medical supply agency?: [facility] Were you able to get the equipment/medical supplies?: Yes Do you have any questions related to the use of the equipment/supplies?: No  Functional  Questionnaire: Do you need assistance with bathing/showering or dressing?: Yes (Family Assists with all ADL's) Do you need assistance with meal preparation?: Yes Do you need assistance with eating?: No Do you have difficulty maintaining continence: Yes Do you need assistance with getting out of bed/getting out of a chair/moving?: Yes Do you have difficulty  managing or taking your medications?: Yes (Son Garrel Manages medications)  Follow up appointments reviewed: PCP Follow-up appointment confirmed?: Yes Date of PCP follow-up appointment?: 06/08/24 Follow-up Provider: Dr. Orpha Specialist [facility] Follow-up appointment confirmed?: NA Do you need transportation to your follow-up appointment?: No Do you understand care options if your condition(s) worsen?: Yes-patient verbalized understanding  SDOH Interventions Today    Flowsheet Row Most Recent Value  SDOH Interventions   Food Insecurity Interventions Intervention Not Indicated  Housing Interventions Intervention Not Indicated  Transportation Interventions Intervention Not Indicated  Utilities Interventions Intervention Not Indicated    Sid Greener J. Zeph Riebel RN, MSN [facility]  [facility], [facility] RN Care Manager Direct Dial: [phone]  Fax: [phone] Website: [email or web address]

## 2024-05-29 NOTE — Patient Instructions (Signed)
 Visit Information  Thank you for taking time to visit with me today. Please don't hesitate to contact me if I can be of assistance to you.  Contact physician if: You have a new or higher fever. You are coughing more deeply or more often. You are not getting better after 2 days (48 hours). You do not get better as expected.    The patient verbalized understanding of instructions, educational materials, and care plan provided today and DECLINED offer to receive copy of patient instructions, educational materials, and care plan.   The patient has been provided with contact information for the care management team and has been advised to call with any health related questions or concerns.   Please call the care guide team at [phone] if you need to cancel or reschedule your appointment.   Please call the Suicide and Crisis Lifeline: 988 if you are experiencing a Mental Health or Behavioral Health Crisis or need someone to talk to.  Adriann Thau J. Merwyn Hodapp RN, MSN [facility], [facility] RN Care Manager Direct Dial: [phone]  Fax: [phone] Website: [email or web address]

## 2024-06-02 DIAGNOSIS — I10 Essential (primary) hypertension: Secondary | ICD-10-CM | POA: Diagnosis not present

## 2024-06-02 DIAGNOSIS — E43 Unspecified severe protein-calorie malnutrition: Secondary | ICD-10-CM | POA: Diagnosis not present

## 2024-06-02 DIAGNOSIS — Z7902 Long term (current) use of antithrombotics/antiplatelets: Secondary | ICD-10-CM | POA: Diagnosis not present

## 2024-06-02 DIAGNOSIS — N390 Urinary tract infection, site not specified: Secondary | ICD-10-CM | POA: Diagnosis not present

## 2024-06-02 DIAGNOSIS — R0902 Hypoxemia: Secondary | ICD-10-CM | POA: Diagnosis not present

## 2024-06-02 DIAGNOSIS — Z9981 Dependence on supplemental oxygen: Secondary | ICD-10-CM | POA: Diagnosis not present

## 2024-06-02 DIAGNOSIS — J189 Pneumonia, unspecified organism: Secondary | ICD-10-CM | POA: Diagnosis not present

## 2024-06-02 DIAGNOSIS — I2510 Atherosclerotic heart disease of native coronary artery without angina pectoris: Secondary | ICD-10-CM | POA: Diagnosis not present

## 2024-06-04 DIAGNOSIS — I10 Essential (primary) hypertension: Secondary | ICD-10-CM | POA: Diagnosis not present

## 2024-06-04 DIAGNOSIS — I2510 Atherosclerotic heart disease of native coronary artery without angina pectoris: Secondary | ICD-10-CM | POA: Diagnosis not present

## 2024-06-04 DIAGNOSIS — R0902 Hypoxemia: Secondary | ICD-10-CM | POA: Diagnosis not present

## 2024-06-04 DIAGNOSIS — N390 Urinary tract infection, site not specified: Secondary | ICD-10-CM | POA: Diagnosis not present

## 2024-06-04 DIAGNOSIS — J189 Pneumonia, unspecified organism: Secondary | ICD-10-CM | POA: Diagnosis not present

## 2024-06-04 DIAGNOSIS — E43 Unspecified severe protein-calorie malnutrition: Secondary | ICD-10-CM | POA: Diagnosis not present

## 2024-06-05 DIAGNOSIS — J918 Pleural effusion in other conditions classified elsewhere: Secondary | ICD-10-CM | POA: Diagnosis not present

## 2024-06-05 DIAGNOSIS — J188 Other pneumonia, unspecified organism: Secondary | ICD-10-CM | POA: Diagnosis not present

## 2024-06-05 DIAGNOSIS — J9602 Acute respiratory failure with hypercapnia: Secondary | ICD-10-CM | POA: Diagnosis not present

## 2024-06-05 DIAGNOSIS — J9622 Acute and chronic respiratory failure with hypercapnia: Secondary | ICD-10-CM | POA: Diagnosis not present

## 2024-06-05 DIAGNOSIS — R4182 Altered mental status, unspecified: Secondary | ICD-10-CM | POA: Diagnosis not present

## 2024-06-05 DIAGNOSIS — N179 Acute kidney failure, unspecified: Secondary | ICD-10-CM | POA: Diagnosis not present

## 2024-06-05 DIAGNOSIS — E0590 Thyrotoxicosis, unspecified without thyrotoxic crisis or storm: Secondary | ICD-10-CM | POA: Diagnosis not present

## 2024-06-05 DIAGNOSIS — N289 Disorder of kidney and ureter, unspecified: Secondary | ICD-10-CM | POA: Diagnosis not present

## 2024-06-05 DIAGNOSIS — I2489 Other forms of acute ischemic heart disease: Secondary | ICD-10-CM | POA: Diagnosis not present

## 2024-06-05 DIAGNOSIS — T501X5A Adverse effect of loop [high-ceiling] diuretics, initial encounter: Secondary | ICD-10-CM | POA: Diagnosis not present

## 2024-06-05 DIAGNOSIS — I130 Hypertensive heart and chronic kidney disease with heart failure and stage 1 through stage 4 chronic kidney disease, or unspecified chronic kidney disease: Secondary | ICD-10-CM | POA: Diagnosis not present

## 2024-06-05 DIAGNOSIS — N390 Urinary tract infection, site not specified: Secondary | ICD-10-CM | POA: Diagnosis not present

## 2024-06-05 DIAGNOSIS — J948 Other specified pleural conditions: Secondary | ICD-10-CM | POA: Diagnosis not present

## 2024-06-05 DIAGNOSIS — I509 Heart failure, unspecified: Secondary | ICD-10-CM | POA: Diagnosis not present

## 2024-06-05 DIAGNOSIS — R062 Wheezing: Secondary | ICD-10-CM | POA: Diagnosis not present

## 2024-06-05 DIAGNOSIS — J9601 Acute respiratory failure with hypoxia: Secondary | ICD-10-CM | POA: Diagnosis not present

## 2024-06-05 DIAGNOSIS — J9621 Acute and chronic respiratory failure with hypoxia: Secondary | ICD-10-CM | POA: Diagnosis not present

## 2024-06-05 DIAGNOSIS — Z681 Body mass index (BMI) 19 or less, adult: Secondary | ICD-10-CM | POA: Diagnosis not present

## 2024-06-05 DIAGNOSIS — I952 Hypotension due to drugs: Secondary | ICD-10-CM | POA: Diagnosis not present

## 2024-06-05 DIAGNOSIS — R410 Disorientation, unspecified: Secondary | ICD-10-CM | POA: Diagnosis not present

## 2024-06-05 DIAGNOSIS — I252 Old myocardial infarction: Secondary | ICD-10-CM | POA: Diagnosis not present

## 2024-06-05 DIAGNOSIS — I5043 Acute on chronic combined systolic (congestive) and diastolic (congestive) heart failure: Secondary | ICD-10-CM | POA: Diagnosis not present

## 2024-06-05 DIAGNOSIS — G9341 Metabolic encephalopathy: Secondary | ICD-10-CM | POA: Diagnosis not present

## 2024-06-05 DIAGNOSIS — J189 Pneumonia, unspecified organism: Secondary | ICD-10-CM | POA: Diagnosis not present

## 2024-06-05 DIAGNOSIS — F03A0 Unspecified dementia, mild, without behavioral disturbance, psychotic disturbance, mood disturbance, and anxiety: Secondary | ICD-10-CM | POA: Diagnosis not present

## 2024-06-05 DIAGNOSIS — I10 Essential (primary) hypertension: Secondary | ICD-10-CM | POA: Diagnosis not present

## 2024-06-05 DIAGNOSIS — E440 Moderate protein-calorie malnutrition: Secondary | ICD-10-CM | POA: Diagnosis not present

## 2024-06-05 DIAGNOSIS — D61818 Other pancytopenia: Secondary | ICD-10-CM | POA: Diagnosis not present

## 2024-06-05 DIAGNOSIS — R0902 Hypoxemia: Secondary | ICD-10-CM | POA: Diagnosis not present

## 2024-06-05 DIAGNOSIS — D696 Thrombocytopenia, unspecified: Secondary | ICD-10-CM | POA: Diagnosis not present

## 2024-06-05 DIAGNOSIS — D649 Anemia, unspecified: Secondary | ICD-10-CM | POA: Diagnosis not present

## 2024-06-05 DIAGNOSIS — D62 Acute posthemorrhagic anemia: Secondary | ICD-10-CM | POA: Diagnosis not present

## 2024-06-05 DIAGNOSIS — E876 Hypokalemia: Secondary | ICD-10-CM | POA: Diagnosis not present

## 2024-06-05 DIAGNOSIS — J90 Pleural effusion, not elsewhere classified: Secondary | ICD-10-CM | POA: Diagnosis not present

## 2024-06-05 DIAGNOSIS — E43 Unspecified severe protein-calorie malnutrition: Secondary | ICD-10-CM | POA: Diagnosis not present

## 2024-06-05 DIAGNOSIS — R079 Chest pain, unspecified: Secondary | ICD-10-CM | POA: Diagnosis not present

## 2024-06-05 DIAGNOSIS — D631 Anemia in chronic kidney disease: Secondary | ICD-10-CM | POA: Diagnosis not present

## 2024-06-05 DIAGNOSIS — K5900 Constipation, unspecified: Secondary | ICD-10-CM | POA: Diagnosis not present

## 2024-06-05 DIAGNOSIS — I2510 Atherosclerotic heart disease of native coronary artery without angina pectoris: Secondary | ICD-10-CM | POA: Diagnosis not present

## 2024-06-05 DIAGNOSIS — F444 Conversion disorder with motor symptom or deficit: Secondary | ICD-10-CM | POA: Diagnosis not present

## 2024-06-05 DIAGNOSIS — Z8673 Personal history of transient ischemic attack (TIA), and cerebral infarction without residual deficits: Secondary | ICD-10-CM | POA: Diagnosis not present

## 2024-06-07 DIAGNOSIS — J948 Other specified pleural conditions: Secondary | ICD-10-CM | POA: Diagnosis not present

## 2024-06-12 DIAGNOSIS — J189 Pneumonia, unspecified organism: Secondary | ICD-10-CM | POA: Diagnosis not present

## 2024-06-12 DIAGNOSIS — I10 Essential (primary) hypertension: Secondary | ICD-10-CM | POA: Diagnosis not present

## 2024-06-12 DIAGNOSIS — I2510 Atherosclerotic heart disease of native coronary artery without angina pectoris: Secondary | ICD-10-CM | POA: Diagnosis not present

## 2024-06-12 DIAGNOSIS — E43 Unspecified severe protein-calorie malnutrition: Secondary | ICD-10-CM | POA: Diagnosis not present

## 2024-06-12 DIAGNOSIS — N390 Urinary tract infection, site not specified: Secondary | ICD-10-CM | POA: Diagnosis not present

## 2024-06-12 DIAGNOSIS — R0902 Hypoxemia: Secondary | ICD-10-CM | POA: Diagnosis not present

## 2024-06-15 DIAGNOSIS — R0902 Hypoxemia: Secondary | ICD-10-CM | POA: Diagnosis not present

## 2024-06-15 DIAGNOSIS — E43 Unspecified severe protein-calorie malnutrition: Secondary | ICD-10-CM | POA: Diagnosis not present

## 2024-06-15 DIAGNOSIS — I2510 Atherosclerotic heart disease of native coronary artery without angina pectoris: Secondary | ICD-10-CM | POA: Diagnosis not present

## 2024-06-15 DIAGNOSIS — N390 Urinary tract infection, site not specified: Secondary | ICD-10-CM | POA: Diagnosis not present

## 2024-06-15 DIAGNOSIS — J189 Pneumonia, unspecified organism: Secondary | ICD-10-CM | POA: Diagnosis not present

## 2024-06-15 DIAGNOSIS — I10 Essential (primary) hypertension: Secondary | ICD-10-CM | POA: Diagnosis not present

## 2024-06-17 DIAGNOSIS — I700 Atherosclerosis of aorta: Secondary | ICD-10-CM | POA: Diagnosis not present

## 2024-06-17 DIAGNOSIS — I5021 Acute systolic (congestive) heart failure: Secondary | ICD-10-CM | POA: Diagnosis not present

## 2024-06-17 DIAGNOSIS — J449 Chronic obstructive pulmonary disease, unspecified: Secondary | ICD-10-CM | POA: Diagnosis not present

## 2024-06-17 DIAGNOSIS — J129 Viral pneumonia, unspecified: Secondary | ICD-10-CM | POA: Diagnosis not present

## 2024-06-17 DIAGNOSIS — E43 Unspecified severe protein-calorie malnutrition: Secondary | ICD-10-CM | POA: Diagnosis not present

## 2024-06-17 DIAGNOSIS — I10 Essential (primary) hypertension: Secondary | ICD-10-CM | POA: Diagnosis not present

## 2024-06-17 DIAGNOSIS — E7849 Other hyperlipidemia: Secondary | ICD-10-CM | POA: Diagnosis not present

## 2024-06-17 DIAGNOSIS — J9601 Acute respiratory failure with hypoxia: Secondary | ICD-10-CM | POA: Diagnosis not present

## 2024-06-17 DIAGNOSIS — I2510 Atherosclerotic heart disease of native coronary artery without angina pectoris: Secondary | ICD-10-CM | POA: Diagnosis not present

## 2024-06-17 DIAGNOSIS — L89309 Pressure ulcer of unspecified buttock, unspecified stage: Secondary | ICD-10-CM | POA: Diagnosis not present

## 2024-06-17 DIAGNOSIS — N182 Chronic kidney disease, stage 2 (mild): Secondary | ICD-10-CM | POA: Diagnosis not present

## 2024-06-17 DIAGNOSIS — N390 Urinary tract infection, site not specified: Secondary | ICD-10-CM | POA: Diagnosis not present

## 2024-06-17 DIAGNOSIS — J189 Pneumonia, unspecified organism: Secondary | ICD-10-CM | POA: Diagnosis not present

## 2024-06-17 DIAGNOSIS — R0902 Hypoxemia: Secondary | ICD-10-CM | POA: Diagnosis not present

## 2024-06-17 DIAGNOSIS — M818 Other osteoporosis without current pathological fracture: Secondary | ICD-10-CM | POA: Diagnosis not present

## 2024-06-17 DIAGNOSIS — N39498 Other specified urinary incontinence: Secondary | ICD-10-CM | POA: Diagnosis not present

## 2024-06-18 DIAGNOSIS — N390 Urinary tract infection, site not specified: Secondary | ICD-10-CM | POA: Diagnosis not present

## 2024-06-18 DIAGNOSIS — I2510 Atherosclerotic heart disease of native coronary artery without angina pectoris: Secondary | ICD-10-CM | POA: Diagnosis not present

## 2024-06-18 DIAGNOSIS — J189 Pneumonia, unspecified organism: Secondary | ICD-10-CM | POA: Diagnosis not present

## 2024-06-18 DIAGNOSIS — E43 Unspecified severe protein-calorie malnutrition: Secondary | ICD-10-CM | POA: Diagnosis not present

## 2024-06-18 DIAGNOSIS — R0902 Hypoxemia: Secondary | ICD-10-CM | POA: Diagnosis not present

## 2024-06-18 DIAGNOSIS — I10 Essential (primary) hypertension: Secondary | ICD-10-CM | POA: Diagnosis not present

## 2024-06-22 DIAGNOSIS — R0902 Hypoxemia: Secondary | ICD-10-CM | POA: Diagnosis not present

## 2024-06-22 DIAGNOSIS — I2510 Atherosclerotic heart disease of native coronary artery without angina pectoris: Secondary | ICD-10-CM | POA: Diagnosis not present

## 2024-06-22 DIAGNOSIS — N390 Urinary tract infection, site not specified: Secondary | ICD-10-CM | POA: Diagnosis not present

## 2024-06-22 DIAGNOSIS — J189 Pneumonia, unspecified organism: Secondary | ICD-10-CM | POA: Diagnosis not present

## 2024-06-22 DIAGNOSIS — I10 Essential (primary) hypertension: Secondary | ICD-10-CM | POA: Diagnosis not present

## 2024-06-22 DIAGNOSIS — E43 Unspecified severe protein-calorie malnutrition: Secondary | ICD-10-CM | POA: Diagnosis not present

## 2024-06-23 DIAGNOSIS — J189 Pneumonia, unspecified organism: Secondary | ICD-10-CM | POA: Diagnosis not present

## 2024-06-23 DIAGNOSIS — R451 Restlessness and agitation: Secondary | ICD-10-CM | POA: Diagnosis not present

## 2024-06-23 DIAGNOSIS — I2510 Atherosclerotic heart disease of native coronary artery without angina pectoris: Secondary | ICD-10-CM | POA: Diagnosis not present

## 2024-06-23 DIAGNOSIS — N390 Urinary tract infection, site not specified: Secondary | ICD-10-CM | POA: Diagnosis not present

## 2024-06-23 DIAGNOSIS — I679 Cerebrovascular disease, unspecified: Secondary | ICD-10-CM | POA: Diagnosis not present

## 2024-06-23 DIAGNOSIS — Z515 Encounter for palliative care: Secondary | ICD-10-CM | POA: Diagnosis not present

## 2024-06-23 DIAGNOSIS — I10 Essential (primary) hypertension: Secondary | ICD-10-CM | POA: Diagnosis not present

## 2024-06-23 DIAGNOSIS — R0902 Hypoxemia: Secondary | ICD-10-CM | POA: Diagnosis not present

## 2024-06-23 DIAGNOSIS — G4700 Insomnia, unspecified: Secondary | ICD-10-CM | POA: Diagnosis not present

## 2024-06-23 DIAGNOSIS — E43 Unspecified severe protein-calorie malnutrition: Secondary | ICD-10-CM | POA: Diagnosis not present

## 2024-06-24 DIAGNOSIS — J189 Pneumonia, unspecified organism: Secondary | ICD-10-CM | POA: Diagnosis not present

## 2024-06-24 DIAGNOSIS — N390 Urinary tract infection, site not specified: Secondary | ICD-10-CM | POA: Diagnosis not present

## 2024-06-24 DIAGNOSIS — I2510 Atherosclerotic heart disease of native coronary artery without angina pectoris: Secondary | ICD-10-CM | POA: Diagnosis not present

## 2024-06-24 DIAGNOSIS — I10 Essential (primary) hypertension: Secondary | ICD-10-CM | POA: Diagnosis not present

## 2024-06-24 DIAGNOSIS — R0902 Hypoxemia: Secondary | ICD-10-CM | POA: Diagnosis not present

## 2024-06-24 DIAGNOSIS — E43 Unspecified severe protein-calorie malnutrition: Secondary | ICD-10-CM | POA: Diagnosis not present

## 2024-06-25 DIAGNOSIS — I2510 Atherosclerotic heart disease of native coronary artery without angina pectoris: Secondary | ICD-10-CM | POA: Diagnosis not present

## 2024-06-25 DIAGNOSIS — I10 Essential (primary) hypertension: Secondary | ICD-10-CM | POA: Diagnosis not present

## 2024-06-25 DIAGNOSIS — J189 Pneumonia, unspecified organism: Secondary | ICD-10-CM | POA: Diagnosis not present

## 2024-06-25 DIAGNOSIS — E43 Unspecified severe protein-calorie malnutrition: Secondary | ICD-10-CM | POA: Diagnosis not present

## 2024-06-25 DIAGNOSIS — R0902 Hypoxemia: Secondary | ICD-10-CM | POA: Diagnosis not present

## 2024-06-25 DIAGNOSIS — N390 Urinary tract infection, site not specified: Secondary | ICD-10-CM | POA: Diagnosis not present

## 2024-06-26 DIAGNOSIS — N390 Urinary tract infection, site not specified: Secondary | ICD-10-CM | POA: Diagnosis not present

## 2024-06-26 DIAGNOSIS — E43 Unspecified severe protein-calorie malnutrition: Secondary | ICD-10-CM | POA: Diagnosis not present

## 2024-06-26 DIAGNOSIS — I10 Essential (primary) hypertension: Secondary | ICD-10-CM | POA: Diagnosis not present

## 2024-06-26 DIAGNOSIS — R0902 Hypoxemia: Secondary | ICD-10-CM | POA: Diagnosis not present

## 2024-06-26 DIAGNOSIS — J189 Pneumonia, unspecified organism: Secondary | ICD-10-CM | POA: Diagnosis not present

## 2024-06-26 DIAGNOSIS — I2510 Atherosclerotic heart disease of native coronary artery without angina pectoris: Secondary | ICD-10-CM | POA: Diagnosis not present

## 2024-06-29 DIAGNOSIS — R0902 Hypoxemia: Secondary | ICD-10-CM | POA: Diagnosis not present

## 2024-06-29 DIAGNOSIS — N390 Urinary tract infection, site not specified: Secondary | ICD-10-CM | POA: Diagnosis not present

## 2024-06-29 DIAGNOSIS — J189 Pneumonia, unspecified organism: Secondary | ICD-10-CM | POA: Diagnosis not present

## 2024-06-29 DIAGNOSIS — E43 Unspecified severe protein-calorie malnutrition: Secondary | ICD-10-CM | POA: Diagnosis not present

## 2024-06-29 DIAGNOSIS — I2510 Atherosclerotic heart disease of native coronary artery without angina pectoris: Secondary | ICD-10-CM | POA: Diagnosis not present

## 2024-06-29 DIAGNOSIS — I10 Essential (primary) hypertension: Secondary | ICD-10-CM | POA: Diagnosis not present

## 2024-06-30 DIAGNOSIS — I10 Essential (primary) hypertension: Secondary | ICD-10-CM | POA: Diagnosis not present

## 2024-06-30 DIAGNOSIS — R0902 Hypoxemia: Secondary | ICD-10-CM | POA: Diagnosis not present

## 2024-06-30 DIAGNOSIS — I2510 Atherosclerotic heart disease of native coronary artery without angina pectoris: Secondary | ICD-10-CM | POA: Diagnosis not present

## 2024-06-30 DIAGNOSIS — E43 Unspecified severe protein-calorie malnutrition: Secondary | ICD-10-CM | POA: Diagnosis not present

## 2024-06-30 DIAGNOSIS — J189 Pneumonia, unspecified organism: Secondary | ICD-10-CM | POA: Diagnosis not present

## 2024-06-30 DIAGNOSIS — N390 Urinary tract infection, site not specified: Secondary | ICD-10-CM | POA: Diagnosis not present

## 2024-07-02 DIAGNOSIS — I2510 Atherosclerotic heart disease of native coronary artery without angina pectoris: Secondary | ICD-10-CM | POA: Diagnosis not present

## 2024-07-02 DIAGNOSIS — L89156 Pressure-induced deep tissue damage of sacral region: Secondary | ICD-10-CM | POA: Diagnosis not present

## 2024-07-02 DIAGNOSIS — I5043 Acute on chronic combined systolic (congestive) and diastolic (congestive) heart failure: Secondary | ICD-10-CM | POA: Diagnosis not present

## 2024-07-02 DIAGNOSIS — J9621 Acute and chronic respiratory failure with hypoxia: Secondary | ICD-10-CM | POA: Diagnosis not present

## 2024-07-02 DIAGNOSIS — S61512A Laceration without foreign body of left wrist, initial encounter: Secondary | ICD-10-CM | POA: Diagnosis not present

## 2024-07-02 DIAGNOSIS — J189 Pneumonia, unspecified organism: Secondary | ICD-10-CM | POA: Diagnosis not present

## 2024-07-02 DIAGNOSIS — N390 Urinary tract infection, site not specified: Secondary | ICD-10-CM | POA: Diagnosis not present

## 2024-07-02 DIAGNOSIS — E43 Unspecified severe protein-calorie malnutrition: Secondary | ICD-10-CM | POA: Diagnosis not present

## 2024-07-02 DIAGNOSIS — J9622 Acute and chronic respiratory failure with hypercapnia: Secondary | ICD-10-CM | POA: Diagnosis not present

## 2024-07-02 DIAGNOSIS — Z9981 Dependence on supplemental oxygen: Secondary | ICD-10-CM | POA: Diagnosis not present

## 2024-07-02 DIAGNOSIS — Z466 Encounter for fitting and adjustment of urinary device: Secondary | ICD-10-CM | POA: Diagnosis not present

## 2024-07-02 DIAGNOSIS — Z7902 Long term (current) use of antithrombotics/antiplatelets: Secondary | ICD-10-CM | POA: Diagnosis not present

## 2024-07-02 DIAGNOSIS — I110 Hypertensive heart disease with heart failure: Secondary | ICD-10-CM | POA: Diagnosis not present

## 2024-07-03 DIAGNOSIS — J9622 Acute and chronic respiratory failure with hypercapnia: Secondary | ICD-10-CM | POA: Diagnosis not present

## 2024-07-03 DIAGNOSIS — I110 Hypertensive heart disease with heart failure: Secondary | ICD-10-CM | POA: Diagnosis not present

## 2024-07-03 DIAGNOSIS — I5043 Acute on chronic combined systolic (congestive) and diastolic (congestive) heart failure: Secondary | ICD-10-CM | POA: Diagnosis not present

## 2024-07-03 DIAGNOSIS — J9621 Acute and chronic respiratory failure with hypoxia: Secondary | ICD-10-CM | POA: Diagnosis not present

## 2024-07-03 DIAGNOSIS — J189 Pneumonia, unspecified organism: Secondary | ICD-10-CM | POA: Diagnosis not present

## 2024-07-03 DIAGNOSIS — L89156 Pressure-induced deep tissue damage of sacral region: Secondary | ICD-10-CM | POA: Diagnosis not present

## 2024-07-06 DIAGNOSIS — I110 Hypertensive heart disease with heart failure: Secondary | ICD-10-CM | POA: Diagnosis not present

## 2024-07-06 DIAGNOSIS — J189 Pneumonia, unspecified organism: Secondary | ICD-10-CM | POA: Diagnosis not present

## 2024-07-06 DIAGNOSIS — J9622 Acute and chronic respiratory failure with hypercapnia: Secondary | ICD-10-CM | POA: Diagnosis not present

## 2024-07-06 DIAGNOSIS — J9621 Acute and chronic respiratory failure with hypoxia: Secondary | ICD-10-CM | POA: Diagnosis not present

## 2024-07-06 DIAGNOSIS — I5043 Acute on chronic combined systolic (congestive) and diastolic (congestive) heart failure: Secondary | ICD-10-CM | POA: Diagnosis not present

## 2024-07-06 DIAGNOSIS — L89156 Pressure-induced deep tissue damage of sacral region: Secondary | ICD-10-CM | POA: Diagnosis not present

## 2024-07-07 DIAGNOSIS — J9622 Acute and chronic respiratory failure with hypercapnia: Secondary | ICD-10-CM | POA: Diagnosis not present

## 2024-07-07 DIAGNOSIS — J189 Pneumonia, unspecified organism: Secondary | ICD-10-CM | POA: Diagnosis not present

## 2024-07-07 DIAGNOSIS — I110 Hypertensive heart disease with heart failure: Secondary | ICD-10-CM | POA: Diagnosis not present

## 2024-07-07 DIAGNOSIS — I5043 Acute on chronic combined systolic (congestive) and diastolic (congestive) heart failure: Secondary | ICD-10-CM | POA: Diagnosis not present

## 2024-07-07 DIAGNOSIS — J9621 Acute and chronic respiratory failure with hypoxia: Secondary | ICD-10-CM | POA: Diagnosis not present

## 2024-07-07 DIAGNOSIS — L89156 Pressure-induced deep tissue damage of sacral region: Secondary | ICD-10-CM | POA: Diagnosis not present

## 2024-07-08 DIAGNOSIS — J9621 Acute and chronic respiratory failure with hypoxia: Secondary | ICD-10-CM | POA: Diagnosis not present

## 2024-07-08 DIAGNOSIS — I5043 Acute on chronic combined systolic (congestive) and diastolic (congestive) heart failure: Secondary | ICD-10-CM | POA: Diagnosis not present

## 2024-07-08 DIAGNOSIS — I110 Hypertensive heart disease with heart failure: Secondary | ICD-10-CM | POA: Diagnosis not present

## 2024-07-08 DIAGNOSIS — L89156 Pressure-induced deep tissue damage of sacral region: Secondary | ICD-10-CM | POA: Diagnosis not present

## 2024-07-08 DIAGNOSIS — J189 Pneumonia, unspecified organism: Secondary | ICD-10-CM | POA: Diagnosis not present

## 2024-07-08 DIAGNOSIS — J9622 Acute and chronic respiratory failure with hypercapnia: Secondary | ICD-10-CM | POA: Diagnosis not present

## 2024-07-10 DIAGNOSIS — L89156 Pressure-induced deep tissue damage of sacral region: Secondary | ICD-10-CM | POA: Diagnosis not present

## 2024-07-10 DIAGNOSIS — R110 Nausea: Secondary | ICD-10-CM | POA: Diagnosis not present

## 2024-07-10 DIAGNOSIS — G9341 Metabolic encephalopathy: Secondary | ICD-10-CM | POA: Diagnosis not present

## 2024-07-10 DIAGNOSIS — S61512D Laceration without foreign body of left wrist, subsequent encounter: Secondary | ICD-10-CM | POA: Diagnosis not present

## 2024-07-10 DIAGNOSIS — J9622 Acute and chronic respiratory failure with hypercapnia: Secondary | ICD-10-CM | POA: Diagnosis not present

## 2024-07-10 DIAGNOSIS — E43 Unspecified severe protein-calorie malnutrition: Secondary | ICD-10-CM | POA: Diagnosis not present

## 2024-07-10 DIAGNOSIS — G8310 Monoplegia of lower limb affecting unspecified side: Secondary | ICD-10-CM | POA: Diagnosis not present

## 2024-07-10 DIAGNOSIS — I5043 Acute on chronic combined systolic (congestive) and diastolic (congestive) heart failure: Secondary | ICD-10-CM | POA: Diagnosis not present

## 2024-07-10 DIAGNOSIS — J189 Pneumonia, unspecified organism: Secondary | ICD-10-CM | POA: Diagnosis not present

## 2024-07-10 DIAGNOSIS — Z993 Dependence on wheelchair: Secondary | ICD-10-CM | POA: Diagnosis not present

## 2024-07-10 DIAGNOSIS — M6250 Muscle wasting and atrophy, not elsewhere classified, unspecified site: Secondary | ICD-10-CM | POA: Diagnosis not present

## 2024-07-10 DIAGNOSIS — F0390 Unspecified dementia without behavioral disturbance: Secondary | ICD-10-CM | POA: Diagnosis not present

## 2024-07-10 DIAGNOSIS — I2510 Atherosclerotic heart disease of native coronary artery without angina pectoris: Secondary | ICD-10-CM | POA: Diagnosis not present

## 2024-07-10 DIAGNOSIS — L89159 Pressure ulcer of sacral region, unspecified stage: Secondary | ICD-10-CM | POA: Diagnosis not present

## 2024-07-10 DIAGNOSIS — E778 Other disorders of glycoprotein metabolism: Secondary | ICD-10-CM | POA: Diagnosis not present

## 2024-07-10 DIAGNOSIS — Z466 Encounter for fitting and adjustment of urinary device: Secondary | ICD-10-CM | POA: Diagnosis not present

## 2024-07-10 DIAGNOSIS — N390 Urinary tract infection, site not specified: Secondary | ICD-10-CM | POA: Diagnosis not present

## 2024-07-10 DIAGNOSIS — Z9981 Dependence on supplemental oxygen: Secondary | ICD-10-CM | POA: Diagnosis not present

## 2024-07-10 DIAGNOSIS — I10 Essential (primary) hypertension: Secondary | ICD-10-CM | POA: Diagnosis not present

## 2024-07-10 DIAGNOSIS — E8809 Other disorders of plasma-protein metabolism, not elsewhere classified: Secondary | ICD-10-CM | POA: Diagnosis not present

## 2024-07-10 DIAGNOSIS — I255 Ischemic cardiomyopathy: Secondary | ICD-10-CM | POA: Diagnosis not present

## 2024-07-10 DIAGNOSIS — R339 Retention of urine, unspecified: Secondary | ICD-10-CM | POA: Diagnosis not present

## 2024-07-10 DIAGNOSIS — R630 Anorexia: Secondary | ICD-10-CM | POA: Diagnosis not present

## 2024-07-10 DIAGNOSIS — I110 Hypertensive heart disease with heart failure: Secondary | ICD-10-CM | POA: Diagnosis not present

## 2024-07-10 DIAGNOSIS — B964 Proteus (mirabilis) (morganii) as the cause of diseases classified elsewhere: Secondary | ICD-10-CM | POA: Diagnosis not present

## 2024-07-10 DIAGNOSIS — J9621 Acute and chronic respiratory failure with hypoxia: Secondary | ICD-10-CM | POA: Diagnosis not present

## 2024-07-10 DIAGNOSIS — Z681 Body mass index (BMI) 19 or less, adult: Secondary | ICD-10-CM | POA: Diagnosis not present

## 2024-07-11 DIAGNOSIS — E8809 Other disorders of plasma-protein metabolism, not elsewhere classified: Secondary | ICD-10-CM | POA: Diagnosis not present

## 2024-07-11 DIAGNOSIS — Z681 Body mass index (BMI) 19 or less, adult: Secondary | ICD-10-CM | POA: Diagnosis not present

## 2024-07-11 DIAGNOSIS — R110 Nausea: Secondary | ICD-10-CM | POA: Diagnosis not present

## 2024-07-11 DIAGNOSIS — M6250 Muscle wasting and atrophy, not elsewhere classified, unspecified site: Secondary | ICD-10-CM | POA: Diagnosis not present

## 2024-07-11 DIAGNOSIS — E43 Unspecified severe protein-calorie malnutrition: Secondary | ICD-10-CM | POA: Diagnosis not present

## 2024-07-11 DIAGNOSIS — R630 Anorexia: Secondary | ICD-10-CM | POA: Diagnosis not present

## 2024-07-13 DIAGNOSIS — R630 Anorexia: Secondary | ICD-10-CM | POA: Diagnosis not present

## 2024-07-13 DIAGNOSIS — E8809 Other disorders of plasma-protein metabolism, not elsewhere classified: Secondary | ICD-10-CM | POA: Diagnosis not present

## 2024-07-13 DIAGNOSIS — E43 Unspecified severe protein-calorie malnutrition: Secondary | ICD-10-CM | POA: Diagnosis not present

## 2024-07-13 DIAGNOSIS — R110 Nausea: Secondary | ICD-10-CM | POA: Diagnosis not present

## 2024-07-13 DIAGNOSIS — M6250 Muscle wasting and atrophy, not elsewhere classified, unspecified site: Secondary | ICD-10-CM | POA: Diagnosis not present

## 2024-07-13 DIAGNOSIS — Z681 Body mass index (BMI) 19 or less, adult: Secondary | ICD-10-CM | POA: Diagnosis not present

## 2024-07-15 DIAGNOSIS — R630 Anorexia: Secondary | ICD-10-CM | POA: Diagnosis not present

## 2024-07-15 DIAGNOSIS — R110 Nausea: Secondary | ICD-10-CM | POA: Diagnosis not present

## 2024-07-15 DIAGNOSIS — M6250 Muscle wasting and atrophy, not elsewhere classified, unspecified site: Secondary | ICD-10-CM | POA: Diagnosis not present

## 2024-07-15 DIAGNOSIS — E43 Unspecified severe protein-calorie malnutrition: Secondary | ICD-10-CM | POA: Diagnosis not present

## 2024-07-15 DIAGNOSIS — Z681 Body mass index (BMI) 19 or less, adult: Secondary | ICD-10-CM | POA: Diagnosis not present

## 2024-07-15 DIAGNOSIS — E8809 Other disorders of plasma-protein metabolism, not elsewhere classified: Secondary | ICD-10-CM | POA: Diagnosis not present

## 2024-07-16 DIAGNOSIS — Z681 Body mass index (BMI) 19 or less, adult: Secondary | ICD-10-CM | POA: Diagnosis not present

## 2024-07-16 DIAGNOSIS — R110 Nausea: Secondary | ICD-10-CM | POA: Diagnosis not present

## 2024-07-16 DIAGNOSIS — E8809 Other disorders of plasma-protein metabolism, not elsewhere classified: Secondary | ICD-10-CM | POA: Diagnosis not present

## 2024-07-16 DIAGNOSIS — R630 Anorexia: Secondary | ICD-10-CM | POA: Diagnosis not present

## 2024-07-16 DIAGNOSIS — M6250 Muscle wasting and atrophy, not elsewhere classified, unspecified site: Secondary | ICD-10-CM | POA: Diagnosis not present

## 2024-07-16 DIAGNOSIS — E43 Unspecified severe protein-calorie malnutrition: Secondary | ICD-10-CM | POA: Diagnosis not present

## 2024-07-17 DIAGNOSIS — E8809 Other disorders of plasma-protein metabolism, not elsewhere classified: Secondary | ICD-10-CM | POA: Diagnosis not present

## 2024-07-17 DIAGNOSIS — R630 Anorexia: Secondary | ICD-10-CM | POA: Diagnosis not present

## 2024-07-17 DIAGNOSIS — R110 Nausea: Secondary | ICD-10-CM | POA: Diagnosis not present

## 2024-07-17 DIAGNOSIS — Z681 Body mass index (BMI) 19 or less, adult: Secondary | ICD-10-CM | POA: Diagnosis not present

## 2024-07-17 DIAGNOSIS — M6250 Muscle wasting and atrophy, not elsewhere classified, unspecified site: Secondary | ICD-10-CM | POA: Diagnosis not present

## 2024-07-17 DIAGNOSIS — E43 Unspecified severe protein-calorie malnutrition: Secondary | ICD-10-CM | POA: Diagnosis not present

## 2024-07-20 DIAGNOSIS — Z681 Body mass index (BMI) 19 or less, adult: Secondary | ICD-10-CM | POA: Diagnosis not present

## 2024-07-20 DIAGNOSIS — R630 Anorexia: Secondary | ICD-10-CM | POA: Diagnosis not present

## 2024-07-20 DIAGNOSIS — E43 Unspecified severe protein-calorie malnutrition: Secondary | ICD-10-CM | POA: Diagnosis not present

## 2024-07-20 DIAGNOSIS — R110 Nausea: Secondary | ICD-10-CM | POA: Diagnosis not present

## 2024-07-20 DIAGNOSIS — E8809 Other disorders of plasma-protein metabolism, not elsewhere classified: Secondary | ICD-10-CM | POA: Diagnosis not present

## 2024-07-20 DIAGNOSIS — M6250 Muscle wasting and atrophy, not elsewhere classified, unspecified site: Secondary | ICD-10-CM | POA: Diagnosis not present

## 2024-07-23 DIAGNOSIS — M6250 Muscle wasting and atrophy, not elsewhere classified, unspecified site: Secondary | ICD-10-CM | POA: Diagnosis not present

## 2024-07-23 DIAGNOSIS — R110 Nausea: Secondary | ICD-10-CM | POA: Diagnosis not present

## 2024-07-23 DIAGNOSIS — R630 Anorexia: Secondary | ICD-10-CM | POA: Diagnosis not present

## 2024-07-23 DIAGNOSIS — E8809 Other disorders of plasma-protein metabolism, not elsewhere classified: Secondary | ICD-10-CM | POA: Diagnosis not present

## 2024-07-23 DIAGNOSIS — E43 Unspecified severe protein-calorie malnutrition: Secondary | ICD-10-CM | POA: Diagnosis not present

## 2024-07-23 DIAGNOSIS — Z681 Body mass index (BMI) 19 or less, adult: Secondary | ICD-10-CM | POA: Diagnosis not present

## 2024-07-27 DIAGNOSIS — E43 Unspecified severe protein-calorie malnutrition: Secondary | ICD-10-CM | POA: Diagnosis not present

## 2024-07-27 DIAGNOSIS — E8809 Other disorders of plasma-protein metabolism, not elsewhere classified: Secondary | ICD-10-CM | POA: Diagnosis not present

## 2024-07-27 DIAGNOSIS — M6250 Muscle wasting and atrophy, not elsewhere classified, unspecified site: Secondary | ICD-10-CM | POA: Diagnosis not present

## 2024-07-27 DIAGNOSIS — Z681 Body mass index (BMI) 19 or less, adult: Secondary | ICD-10-CM | POA: Diagnosis not present

## 2024-07-27 DIAGNOSIS — R110 Nausea: Secondary | ICD-10-CM | POA: Diagnosis not present

## 2024-07-27 DIAGNOSIS — R630 Anorexia: Secondary | ICD-10-CM | POA: Diagnosis not present

## 2024-07-28 DIAGNOSIS — E43 Unspecified severe protein-calorie malnutrition: Secondary | ICD-10-CM | POA: Diagnosis not present

## 2024-07-28 DIAGNOSIS — E8809 Other disorders of plasma-protein metabolism, not elsewhere classified: Secondary | ICD-10-CM | POA: Diagnosis not present

## 2024-07-28 DIAGNOSIS — R110 Nausea: Secondary | ICD-10-CM | POA: Diagnosis not present

## 2024-07-28 DIAGNOSIS — M6250 Muscle wasting and atrophy, not elsewhere classified, unspecified site: Secondary | ICD-10-CM | POA: Diagnosis not present

## 2024-07-28 DIAGNOSIS — Z681 Body mass index (BMI) 19 or less, adult: Secondary | ICD-10-CM | POA: Diagnosis not present

## 2024-07-28 DIAGNOSIS — R630 Anorexia: Secondary | ICD-10-CM | POA: Diagnosis not present

## 2024-07-30 DIAGNOSIS — M6250 Muscle wasting and atrophy, not elsewhere classified, unspecified site: Secondary | ICD-10-CM | POA: Diagnosis not present

## 2024-07-30 DIAGNOSIS — E8809 Other disorders of plasma-protein metabolism, not elsewhere classified: Secondary | ICD-10-CM | POA: Diagnosis not present

## 2024-07-30 DIAGNOSIS — R110 Nausea: Secondary | ICD-10-CM | POA: Diagnosis not present

## 2024-07-30 DIAGNOSIS — R630 Anorexia: Secondary | ICD-10-CM | POA: Diagnosis not present

## 2024-07-30 DIAGNOSIS — E43 Unspecified severe protein-calorie malnutrition: Secondary | ICD-10-CM | POA: Diagnosis not present

## 2024-07-30 DIAGNOSIS — Z681 Body mass index (BMI) 19 or less, adult: Secondary | ICD-10-CM | POA: Diagnosis not present

## 2024-07-31 DIAGNOSIS — R110 Nausea: Secondary | ICD-10-CM | POA: Diagnosis not present

## 2024-07-31 DIAGNOSIS — R630 Anorexia: Secondary | ICD-10-CM | POA: Diagnosis not present

## 2024-07-31 DIAGNOSIS — E43 Unspecified severe protein-calorie malnutrition: Secondary | ICD-10-CM | POA: Diagnosis not present

## 2024-07-31 DIAGNOSIS — E8809 Other disorders of plasma-protein metabolism, not elsewhere classified: Secondary | ICD-10-CM | POA: Diagnosis not present

## 2024-07-31 DIAGNOSIS — M6250 Muscle wasting and atrophy, not elsewhere classified, unspecified site: Secondary | ICD-10-CM | POA: Diagnosis not present

## 2024-07-31 DIAGNOSIS — Z681 Body mass index (BMI) 19 or less, adult: Secondary | ICD-10-CM | POA: Diagnosis not present

## 2024-08-01 DIAGNOSIS — R110 Nausea: Secondary | ICD-10-CM | POA: Diagnosis not present

## 2024-08-01 DIAGNOSIS — G9341 Metabolic encephalopathy: Secondary | ICD-10-CM | POA: Diagnosis not present

## 2024-08-01 DIAGNOSIS — J189 Pneumonia, unspecified organism: Secondary | ICD-10-CM | POA: Diagnosis not present

## 2024-08-01 DIAGNOSIS — R339 Retention of urine, unspecified: Secondary | ICD-10-CM | POA: Diagnosis not present

## 2024-08-01 DIAGNOSIS — E8809 Other disorders of plasma-protein metabolism, not elsewhere classified: Secondary | ICD-10-CM | POA: Diagnosis not present

## 2024-08-01 DIAGNOSIS — I10 Essential (primary) hypertension: Secondary | ICD-10-CM | POA: Diagnosis not present

## 2024-08-01 DIAGNOSIS — E778 Other disorders of glycoprotein metabolism: Secondary | ICD-10-CM | POA: Diagnosis not present

## 2024-08-01 DIAGNOSIS — E43 Unspecified severe protein-calorie malnutrition: Secondary | ICD-10-CM | POA: Diagnosis not present

## 2024-08-01 DIAGNOSIS — N390 Urinary tract infection, site not specified: Secondary | ICD-10-CM | POA: Diagnosis not present

## 2024-08-01 DIAGNOSIS — I255 Ischemic cardiomyopathy: Secondary | ICD-10-CM | POA: Diagnosis not present

## 2024-08-01 DIAGNOSIS — S61512D Laceration without foreign body of left wrist, subsequent encounter: Secondary | ICD-10-CM | POA: Diagnosis not present

## 2024-08-01 DIAGNOSIS — L89159 Pressure ulcer of sacral region, unspecified stage: Secondary | ICD-10-CM | POA: Diagnosis not present

## 2024-08-01 DIAGNOSIS — F0390 Unspecified dementia without behavioral disturbance: Secondary | ICD-10-CM | POA: Diagnosis not present

## 2024-08-01 DIAGNOSIS — Z466 Encounter for fitting and adjustment of urinary device: Secondary | ICD-10-CM | POA: Diagnosis not present

## 2024-08-01 DIAGNOSIS — Z681 Body mass index (BMI) 19 or less, adult: Secondary | ICD-10-CM | POA: Diagnosis not present

## 2024-08-01 DIAGNOSIS — M6250 Muscle wasting and atrophy, not elsewhere classified, unspecified site: Secondary | ICD-10-CM | POA: Diagnosis not present

## 2024-08-01 DIAGNOSIS — B964 Proteus (mirabilis) (morganii) as the cause of diseases classified elsewhere: Secondary | ICD-10-CM | POA: Diagnosis not present

## 2024-08-01 DIAGNOSIS — Z993 Dependence on wheelchair: Secondary | ICD-10-CM | POA: Diagnosis not present

## 2024-08-01 DIAGNOSIS — Z9981 Dependence on supplemental oxygen: Secondary | ICD-10-CM | POA: Diagnosis not present

## 2024-08-01 DIAGNOSIS — R630 Anorexia: Secondary | ICD-10-CM | POA: Diagnosis not present

## 2024-08-01 DIAGNOSIS — I2510 Atherosclerotic heart disease of native coronary artery without angina pectoris: Secondary | ICD-10-CM | POA: Diagnosis not present

## 2024-08-01 DIAGNOSIS — G8310 Monoplegia of lower limb affecting unspecified side: Secondary | ICD-10-CM | POA: Diagnosis not present

## 2024-08-03 DIAGNOSIS — M6250 Muscle wasting and atrophy, not elsewhere classified, unspecified site: Secondary | ICD-10-CM | POA: Diagnosis not present

## 2024-08-03 DIAGNOSIS — R630 Anorexia: Secondary | ICD-10-CM | POA: Diagnosis not present

## 2024-08-03 DIAGNOSIS — R110 Nausea: Secondary | ICD-10-CM | POA: Diagnosis not present

## 2024-08-03 DIAGNOSIS — E43 Unspecified severe protein-calorie malnutrition: Secondary | ICD-10-CM | POA: Diagnosis not present

## 2024-08-03 DIAGNOSIS — E8809 Other disorders of plasma-protein metabolism, not elsewhere classified: Secondary | ICD-10-CM | POA: Diagnosis not present

## 2024-08-03 DIAGNOSIS — Z681 Body mass index (BMI) 19 or less, adult: Secondary | ICD-10-CM | POA: Diagnosis not present

## 2024-08-04 DIAGNOSIS — E43 Unspecified severe protein-calorie malnutrition: Secondary | ICD-10-CM | POA: Diagnosis not present

## 2024-08-04 DIAGNOSIS — R110 Nausea: Secondary | ICD-10-CM | POA: Diagnosis not present

## 2024-08-04 DIAGNOSIS — Z681 Body mass index (BMI) 19 or less, adult: Secondary | ICD-10-CM | POA: Diagnosis not present

## 2024-08-04 DIAGNOSIS — R630 Anorexia: Secondary | ICD-10-CM | POA: Diagnosis not present

## 2024-08-04 DIAGNOSIS — E8809 Other disorders of plasma-protein metabolism, not elsewhere classified: Secondary | ICD-10-CM | POA: Diagnosis not present

## 2024-08-04 DIAGNOSIS — M6250 Muscle wasting and atrophy, not elsewhere classified, unspecified site: Secondary | ICD-10-CM | POA: Diagnosis not present

## 2024-08-06 DIAGNOSIS — E8809 Other disorders of plasma-protein metabolism, not elsewhere classified: Secondary | ICD-10-CM | POA: Diagnosis not present

## 2024-08-06 DIAGNOSIS — M6250 Muscle wasting and atrophy, not elsewhere classified, unspecified site: Secondary | ICD-10-CM | POA: Diagnosis not present

## 2024-08-06 DIAGNOSIS — E43 Unspecified severe protein-calorie malnutrition: Secondary | ICD-10-CM | POA: Diagnosis not present

## 2024-08-06 DIAGNOSIS — R110 Nausea: Secondary | ICD-10-CM | POA: Diagnosis not present

## 2024-08-06 DIAGNOSIS — Z681 Body mass index (BMI) 19 or less, adult: Secondary | ICD-10-CM | POA: Diagnosis not present

## 2024-08-06 DIAGNOSIS — R630 Anorexia: Secondary | ICD-10-CM | POA: Diagnosis not present

## 2024-08-07 DIAGNOSIS — Z681 Body mass index (BMI) 19 or less, adult: Secondary | ICD-10-CM | POA: Diagnosis not present

## 2024-08-07 DIAGNOSIS — M6250 Muscle wasting and atrophy, not elsewhere classified, unspecified site: Secondary | ICD-10-CM | POA: Diagnosis not present

## 2024-08-07 DIAGNOSIS — E43 Unspecified severe protein-calorie malnutrition: Secondary | ICD-10-CM | POA: Diagnosis not present

## 2024-08-07 DIAGNOSIS — R110 Nausea: Secondary | ICD-10-CM | POA: Diagnosis not present

## 2024-08-07 DIAGNOSIS — E8809 Other disorders of plasma-protein metabolism, not elsewhere classified: Secondary | ICD-10-CM | POA: Diagnosis not present

## 2024-08-07 DIAGNOSIS — R630 Anorexia: Secondary | ICD-10-CM | POA: Diagnosis not present

## 2024-08-10 DIAGNOSIS — M6250 Muscle wasting and atrophy, not elsewhere classified, unspecified site: Secondary | ICD-10-CM | POA: Diagnosis not present

## 2024-08-10 DIAGNOSIS — E8809 Other disorders of plasma-protein metabolism, not elsewhere classified: Secondary | ICD-10-CM | POA: Diagnosis not present

## 2024-08-10 DIAGNOSIS — R630 Anorexia: Secondary | ICD-10-CM | POA: Diagnosis not present

## 2024-08-10 DIAGNOSIS — R110 Nausea: Secondary | ICD-10-CM | POA: Diagnosis not present

## 2024-08-10 DIAGNOSIS — E43 Unspecified severe protein-calorie malnutrition: Secondary | ICD-10-CM | POA: Diagnosis not present

## 2024-08-10 DIAGNOSIS — Z681 Body mass index (BMI) 19 or less, adult: Secondary | ICD-10-CM | POA: Diagnosis not present

## 2024-08-11 DIAGNOSIS — R110 Nausea: Secondary | ICD-10-CM | POA: Diagnosis not present

## 2024-08-11 DIAGNOSIS — Z681 Body mass index (BMI) 19 or less, adult: Secondary | ICD-10-CM | POA: Diagnosis not present

## 2024-08-11 DIAGNOSIS — E43 Unspecified severe protein-calorie malnutrition: Secondary | ICD-10-CM | POA: Diagnosis not present

## 2024-08-11 DIAGNOSIS — M6250 Muscle wasting and atrophy, not elsewhere classified, unspecified site: Secondary | ICD-10-CM | POA: Diagnosis not present

## 2024-08-11 DIAGNOSIS — E8809 Other disorders of plasma-protein metabolism, not elsewhere classified: Secondary | ICD-10-CM | POA: Diagnosis not present

## 2024-08-11 DIAGNOSIS — R630 Anorexia: Secondary | ICD-10-CM | POA: Diagnosis not present

## 2024-08-13 DIAGNOSIS — R630 Anorexia: Secondary | ICD-10-CM | POA: Diagnosis not present

## 2024-08-13 DIAGNOSIS — E43 Unspecified severe protein-calorie malnutrition: Secondary | ICD-10-CM | POA: Diagnosis not present

## 2024-08-13 DIAGNOSIS — M6250 Muscle wasting and atrophy, not elsewhere classified, unspecified site: Secondary | ICD-10-CM | POA: Diagnosis not present

## 2024-08-13 DIAGNOSIS — E8809 Other disorders of plasma-protein metabolism, not elsewhere classified: Secondary | ICD-10-CM | POA: Diagnosis not present

## 2024-08-13 DIAGNOSIS — Z681 Body mass index (BMI) 19 or less, adult: Secondary | ICD-10-CM | POA: Diagnosis not present

## 2024-08-13 DIAGNOSIS — R110 Nausea: Secondary | ICD-10-CM | POA: Diagnosis not present

## 2024-08-14 DIAGNOSIS — R630 Anorexia: Secondary | ICD-10-CM | POA: Diagnosis not present

## 2024-08-14 DIAGNOSIS — Z681 Body mass index (BMI) 19 or less, adult: Secondary | ICD-10-CM | POA: Diagnosis not present

## 2024-08-14 DIAGNOSIS — R110 Nausea: Secondary | ICD-10-CM | POA: Diagnosis not present

## 2024-08-14 DIAGNOSIS — E8809 Other disorders of plasma-protein metabolism, not elsewhere classified: Secondary | ICD-10-CM | POA: Diagnosis not present

## 2024-08-14 DIAGNOSIS — M6250 Muscle wasting and atrophy, not elsewhere classified, unspecified site: Secondary | ICD-10-CM | POA: Diagnosis not present

## 2024-08-14 DIAGNOSIS — E43 Unspecified severe protein-calorie malnutrition: Secondary | ICD-10-CM | POA: Diagnosis not present

## 2024-08-17 DIAGNOSIS — Z681 Body mass index (BMI) 19 or less, adult: Secondary | ICD-10-CM | POA: Diagnosis not present

## 2024-08-17 DIAGNOSIS — R630 Anorexia: Secondary | ICD-10-CM | POA: Diagnosis not present

## 2024-08-17 DIAGNOSIS — E43 Unspecified severe protein-calorie malnutrition: Secondary | ICD-10-CM | POA: Diagnosis not present

## 2024-08-17 DIAGNOSIS — R110 Nausea: Secondary | ICD-10-CM | POA: Diagnosis not present

## 2024-08-17 DIAGNOSIS — M6250 Muscle wasting and atrophy, not elsewhere classified, unspecified site: Secondary | ICD-10-CM | POA: Diagnosis not present

## 2024-08-17 DIAGNOSIS — E8809 Other disorders of plasma-protein metabolism, not elsewhere classified: Secondary | ICD-10-CM | POA: Diagnosis not present

## 2024-08-20 DIAGNOSIS — R630 Anorexia: Secondary | ICD-10-CM | POA: Diagnosis not present

## 2024-08-20 DIAGNOSIS — R110 Nausea: Secondary | ICD-10-CM | POA: Diagnosis not present

## 2024-08-20 DIAGNOSIS — E8809 Other disorders of plasma-protein metabolism, not elsewhere classified: Secondary | ICD-10-CM | POA: Diagnosis not present

## 2024-08-20 DIAGNOSIS — M6250 Muscle wasting and atrophy, not elsewhere classified, unspecified site: Secondary | ICD-10-CM | POA: Diagnosis not present

## 2024-08-20 DIAGNOSIS — E43 Unspecified severe protein-calorie malnutrition: Secondary | ICD-10-CM | POA: Diagnosis not present

## 2024-08-20 DIAGNOSIS — Z681 Body mass index (BMI) 19 or less, adult: Secondary | ICD-10-CM | POA: Diagnosis not present

## 2024-08-21 DIAGNOSIS — Z681 Body mass index (BMI) 19 or less, adult: Secondary | ICD-10-CM | POA: Diagnosis not present

## 2024-08-21 DIAGNOSIS — R630 Anorexia: Secondary | ICD-10-CM | POA: Diagnosis not present

## 2024-08-21 DIAGNOSIS — M6250 Muscle wasting and atrophy, not elsewhere classified, unspecified site: Secondary | ICD-10-CM | POA: Diagnosis not present

## 2024-08-21 DIAGNOSIS — E8809 Other disorders of plasma-protein metabolism, not elsewhere classified: Secondary | ICD-10-CM | POA: Diagnosis not present

## 2024-08-21 DIAGNOSIS — E43 Unspecified severe protein-calorie malnutrition: Secondary | ICD-10-CM | POA: Diagnosis not present

## 2024-08-21 DIAGNOSIS — R110 Nausea: Secondary | ICD-10-CM | POA: Diagnosis not present

## 2024-08-24 DIAGNOSIS — R110 Nausea: Secondary | ICD-10-CM | POA: Diagnosis not present

## 2024-08-24 DIAGNOSIS — R630 Anorexia: Secondary | ICD-10-CM | POA: Diagnosis not present

## 2024-08-24 DIAGNOSIS — M6250 Muscle wasting and atrophy, not elsewhere classified, unspecified site: Secondary | ICD-10-CM | POA: Diagnosis not present

## 2024-08-24 DIAGNOSIS — E8809 Other disorders of plasma-protein metabolism, not elsewhere classified: Secondary | ICD-10-CM | POA: Diagnosis not present

## 2024-08-24 DIAGNOSIS — E43 Unspecified severe protein-calorie malnutrition: Secondary | ICD-10-CM | POA: Diagnosis not present

## 2024-08-24 DIAGNOSIS — Z681 Body mass index (BMI) 19 or less, adult: Secondary | ICD-10-CM | POA: Diagnosis not present

## 2024-08-25 DIAGNOSIS — E8809 Other disorders of plasma-protein metabolism, not elsewhere classified: Secondary | ICD-10-CM | POA: Diagnosis not present

## 2024-08-25 DIAGNOSIS — M6250 Muscle wasting and atrophy, not elsewhere classified, unspecified site: Secondary | ICD-10-CM | POA: Diagnosis not present

## 2024-08-25 DIAGNOSIS — Z681 Body mass index (BMI) 19 or less, adult: Secondary | ICD-10-CM | POA: Diagnosis not present

## 2024-08-25 DIAGNOSIS — E43 Unspecified severe protein-calorie malnutrition: Secondary | ICD-10-CM | POA: Diagnosis not present

## 2024-08-25 DIAGNOSIS — R630 Anorexia: Secondary | ICD-10-CM | POA: Diagnosis not present

## 2024-08-25 DIAGNOSIS — R110 Nausea: Secondary | ICD-10-CM | POA: Diagnosis not present

## 2024-08-28 DIAGNOSIS — E43 Unspecified severe protein-calorie malnutrition: Secondary | ICD-10-CM | POA: Diagnosis not present

## 2024-08-28 DIAGNOSIS — E8809 Other disorders of plasma-protein metabolism, not elsewhere classified: Secondary | ICD-10-CM | POA: Diagnosis not present

## 2024-08-28 DIAGNOSIS — M6250 Muscle wasting and atrophy, not elsewhere classified, unspecified site: Secondary | ICD-10-CM | POA: Diagnosis not present

## 2024-08-28 DIAGNOSIS — R110 Nausea: Secondary | ICD-10-CM | POA: Diagnosis not present

## 2024-08-28 DIAGNOSIS — R630 Anorexia: Secondary | ICD-10-CM | POA: Diagnosis not present

## 2024-08-28 DIAGNOSIS — Z681 Body mass index (BMI) 19 or less, adult: Secondary | ICD-10-CM | POA: Diagnosis not present

## 2024-11-01 DEATH — deceased
